# Patient Record
Sex: Female | Born: 1985 | Race: White | Hispanic: No | Marital: Married | Smoking: Never smoker
Health system: Southern US, Community
[De-identification: ages and names within clinical notes are randomized; demographics above are authoritative.]

---
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
## 2015-09-01 ENCOUNTER — Encounter (HOSPITAL_COMMUNITY): Payer: Self-pay | Admitting: Internal Medicine

## 2015-09-01 ENCOUNTER — Inpatient Hospital Stay (HOSPITAL_COMMUNITY)
Admission: EM | Admit: 2015-09-01 | Discharge: 2015-09-03 | DRG: 419 | Disposition: A | Discharge status: Home / Self Care | Payer: PRIVATE HEALTH INSURANCE | Source: Other Acute Inpatient Hospital | Attending: Family Medicine | Admitting: Family Medicine

## 2015-09-01 ENCOUNTER — Observation Stay (HOSPITAL_COMMUNITY): Payer: PRIVATE HEALTH INSURANCE

## 2015-09-01 ENCOUNTER — Encounter (HOSPITAL_COMMUNITY)
Admission: EM | Disposition: A | Discharge status: Home / Self Care | Payer: Self-pay | Source: Other Acute Inpatient Hospital | Attending: Family Medicine

## 2015-09-01 DIAGNOSIS — R1011 Right upper quadrant pain: Secondary | ICD-10-CM

## 2015-09-01 DIAGNOSIS — R109 Unspecified abdominal pain: Secondary | ICD-10-CM | POA: Diagnosis present

## 2015-09-01 DIAGNOSIS — K219 Gastro-esophageal reflux disease without esophagitis: Secondary | ICD-10-CM | POA: Diagnosis present

## 2015-09-01 DIAGNOSIS — K838 Other specified diseases of biliary tract: Secondary | ICD-10-CM

## 2015-09-01 DIAGNOSIS — R1013 Epigastric pain: Secondary | ICD-10-CM

## 2015-09-01 DIAGNOSIS — Z79899 Other long term (current) drug therapy: Secondary | ICD-10-CM

## 2015-09-01 DIAGNOSIS — K8020 Calculus of gallbladder without cholecystitis without obstruction: Secondary | ICD-10-CM | POA: Diagnosis not present

## 2015-09-01 DIAGNOSIS — K8050 Calculus of bile duct without cholangitis or cholecystitis without obstruction: Secondary | ICD-10-CM | POA: Diagnosis not present

## 2015-09-01 DIAGNOSIS — K810 Acute cholecystitis: Principal | ICD-10-CM | POA: Diagnosis present

## 2015-09-01 DIAGNOSIS — Z419 Encounter for procedure for purposes other than remedying health state, unspecified: Secondary | ICD-10-CM

## 2015-09-01 LAB — CBC WITH DIFFERENTIAL/PLATELET
BASOS PCT: 1 %
Basophils Absolute: 0 10*3/uL (ref 0.0–0.1)
EOS PCT: 2 %
Eosinophils Absolute: 0.1 10*3/uL (ref 0.0–0.7)
HEMATOCRIT: 33.9 % — AB (ref 36.0–46.0)
Hemoglobin: 11.3 g/dL — ABNORMAL LOW (ref 12.0–15.0)
Lymphocytes Relative: 30 %
Lymphs Abs: 2.1 10*3/uL (ref 0.7–4.0)
MCH: 31.3 pg (ref 26.0–34.0)
MCHC: 33.3 g/dL (ref 30.0–36.0)
MCV: 93.9 fL (ref 78.0–100.0)
MONO ABS: 0.5 10*3/uL (ref 0.1–1.0)
MONOS PCT: 8 %
NEUTROS ABS: 4.2 10*3/uL (ref 1.7–7.7)
Neutrophils Relative %: 59 %
PLATELETS: 184 10*3/uL (ref 150–400)
RBC: 3.61 MIL/uL — ABNORMAL LOW (ref 3.87–5.11)
RDW: 12.7 % (ref 11.5–15.5)
WBC: 7 10*3/uL (ref 4.0–10.5)

## 2015-09-01 LAB — HEPATIC FUNCTION PANEL
ALK PHOS: 37 U/L — AB (ref 38–126)
ALT: 35 U/L (ref 14–54)
AST: 25 U/L (ref 15–41)
Albumin: 3.5 g/dL (ref 3.5–5.0)
BILIRUBIN DIRECT: 0.2 mg/dL (ref 0.1–0.5)
Indirect Bilirubin: 1.1 mg/dL — ABNORMAL HIGH (ref 0.3–0.9)
Total Bilirubin: 1.3 mg/dL — ABNORMAL HIGH (ref 0.3–1.2)
Total Protein: 6 g/dL — ABNORMAL LOW (ref 6.5–8.1)

## 2015-09-01 LAB — BASIC METABOLIC PANEL
Anion gap: 5 (ref 5–15)
BUN: 5 mg/dL — ABNORMAL LOW (ref 6–20)
CALCIUM: 8.5 mg/dL — AB (ref 8.9–10.3)
CO2: 25 mmol/L (ref 22–32)
CREATININE: 0.64 mg/dL (ref 0.44–1.00)
Chloride: 111 mmol/L (ref 101–111)
GLUCOSE: 85 mg/dL (ref 65–99)
Potassium: 3.8 mmol/L (ref 3.5–5.1)
Sodium: 141 mmol/L (ref 135–145)

## 2015-09-01 LAB — PREGNANCY, URINE: Preg Test, Ur: NEGATIVE

## 2015-09-01 SURGERY — ERCP, WITH INTERVENTION IF INDICATED
Anesthesia: Monitor Anesthesia Care

## 2015-09-01 MED ORDER — SODIUM CHLORIDE 0.9 % IV SOLN
INTRAVENOUS | Status: AC
  Administered 2015-09-01: 03:00:00 via INTRAVENOUS

## 2015-09-01 MED ORDER — PIPERACILLIN-TAZOBACTAM 3.375 G IVPB
3.3750 g | Freq: Three times a day (TID) | INTRAVENOUS | Status: DC
  Administered 2015-09-01 – 2015-09-03 (×8): 3.375 g via INTRAVENOUS
  Filled 2015-09-01 (×9): qty 50

## 2015-09-01 MED ORDER — ACETAMINOPHEN 325 MG PO TABS
650.0000 mg | ORAL_TABLET | Freq: Four times a day (QID) | ORAL | Status: DC | PRN
  Administered 2015-09-02: 650 mg via ORAL
  Filled 2015-09-01 (×2): qty 2

## 2015-09-01 MED ORDER — ONDANSETRON HCL 4 MG PO TABS: 4.0000 mg | ORAL_TABLET | Freq: Four times a day (QID) | ORAL | Status: DC | PRN

## 2015-09-01 MED ORDER — ACETAMINOPHEN 650 MG RE SUPP: 650.0000 mg | Freq: Four times a day (QID) | RECTAL | Status: DC | PRN

## 2015-09-01 MED ORDER — MORPHINE SULFATE (PF) 2 MG/ML IV SOLN: 1.0000 mg | INTRAVENOUS | Status: DC | PRN

## 2015-09-01 MED ORDER — ONDANSETRON HCL 4 MG/2ML IJ SOLN
4.0000 mg | Freq: Four times a day (QID) | INTRAMUSCULAR | Status: DC | PRN
Start: 2015-09-01 — End: 2015-09-03
  Administered 2015-09-03: 4 mg via INTRAVENOUS
  Filled 2015-09-01: qty 2

## 2015-09-01 MED ORDER — GADOBENATE DIMEGLUMINE 529 MG/ML IV SOLN
15.0000 mL | Freq: Once | INTRAVENOUS | Status: AC
  Administered 2015-09-01: 13 mL via INTRAVENOUS

## 2015-09-01 NOTE — Progress Notes (Signed)
Pharmacy Antibiotic Note  Emily HerrlichBethany L Liu is a 30 y.o. female admitted on 09/01/2015 with Choledocholithiasis.  Pharmacy has been consulted for zosyn dosing.  Plan: Zosyn 3.375gm IV now over 30 min then 3.375gm IV q8h - subsequent doses over 4 hours Will f/u micro data, renal function, and pt's clinical condition   Height: 5\' 5"  (165.1 cm) Weight: 146 lb 9.6 oz (66.5 kg) IBW/kg (Calculated) : 57  Temp (24hrs), Avg:98.6 F (37 C), Min:98.6 F (37 C), Max:98.6 F (37 C)  No results for input(s): WBC, CREATININE, LATICACIDVEN, VANCOTROUGH, VANCOPEAK, VANCORANDOM, GENTTROUGH, GENTPEAK, GENTRANDOM, TOBRATROUGH, TOBRAPEAK, TOBRARND, AMIKACINPEAK, AMIKACINTROU, AMIKACIN in the last 168 hours.  CrCl cannot be calculated (No order found.).    Not on File  Antimicrobials this admission: 8/9 Zosyn >>   Thank you for allowing pharmacy to be a part of this patient's care.  Christoper Fabianaron Skylur Fuston, PharmD, BCPS Clinical pharmacist, pager [phone] 09/01/2015 2:41 AM

## 2015-09-01 NOTE — Consult Note (Signed)
[facility] Consult: 8:15 AM 09/01/2015  LOS: 1 day    Referring Provider: Dr Melynda Ripple  Primary Care Physician:  Blane Ohara, MD Primary Gastroenterologist:  unassigned     Reason for Consultation:  Choledocholithiasis suspected   HPI: Emily Liu is a 30 y.o. female.  No signif health issues.  Hx GERD controlled with prn Zantac.  04/2014 C section.  Tympanoplasty with tube placement on right x 2, reconstructive ear drum surgery x 1.  Tonsillectomy.   Went to [facility] ED yesterday after acute onset of epigastric pain, nausea.  sxs started 2 AM on 8/7.  Got a bit better but acute recurrence early AM on 8/8. Pain is in epigastrium and radiates into the back.  LFTs elevated, AST/ALT 49/53, improved this AM. .   Ultrasound with 8.5 mm CBD and ? CBD stone, gallstones.   Started on abx.        FM hx:  Mat uncle with hx colon cancer.  Mom has hx adenomatous colon polyps and has "yearly" colonoscopies.  Mom and several female relative on mom's side with GB disease.   Social: works full time as Haematologist.  Infrequent ETOH.  No smoking.     Prior to Admission medications   Medication Sig Start Date End Date Taking? Authorizing Provider  MONONESSA 0.25-35 MG-MCG tablet Take 1 tablet by mouth daily. 08/07/15  Yes Historical Provider, MD  ranitidine (ZANTAC) 150 MG tablet Take 150 mg by mouth daily as needed for heartburn.   Yes Historical Provider, MD    Scheduled Meds: . piperacillin-tazobactam (ZOSYN)  IV  3.375 g Intravenous Q8H   Infusions: . sodium chloride 100 mL/hr at 09/01/15 0245   PRN Meds: acetaminophen **OR** acetaminophen, morphine injection, ondansetron **OR** ondansetron (ZOFRAN) IV   Allergies as of 08/31/2015  . (Not on File)    Family History  Problem Relation Age of Onset  .  Diabetes Maternal Grandfather     Social History   Social History  . Marital status: Married   Social History Main Topics  . Smoking status: Never Smoker  . Smokeless tobacco: Never Used  . Alcohol use No   Constitutional:  No weakness  ENT:  No nose bleeds Pulm:  No SOB though deep breathing made pain worse.  CV:  No palpitations, no LE edema.  GU:  No hematuria, no frequency GI:  Per HPI Heme:  No issues with excessive bleeding   Transfusions:  none Neuro:  No headaches, no peripheral tingling or numbness Derm:  No itching, no rash or sores.  Endocrine:  No sweats or chills.  No polyuria or dysuria Immunization:  Not queried Travel:  None beyond local counties in last few months.    PHYSICAL EXAM: Vital signs in last 24 hours: Vitals:   09/01/15 0100 09/01/15 0535  BP: 114/73 101/62  Pulse: 84 81  Resp: 18 19  Temp: 98.6 F (37 C) 98.1 F (36.7 C)   Wt Readings from Last 3 Encounters:  09/01/15 66.5 kg (146 lb 9.6 oz)    General: pleasant,  comfortable.  Looks well. Head:  No swelling or asymmetry.   Eyes:  No icterus or pallor Ears:  Not HOH  Nose:  No discharge Mouth:  Clear, moist, good dentition Neck:  No JVD or masses Lungs:  Clear bil.  Unlabored breathing Heart: RRR.  No mrg Abdomen:  Soft, ND.  BS hypoactive.  Tender at upper abdomen, worst at epigastrium.  No guard or rebound.   Musc/Skeltl: no joint deformity Extremities:  No CCE  Neurologic:  Oriented x 3.  Fully alert.  No gross deficits.  Skin:  No rash, jaundice or sores Tattoos:  none   Psych:  Pleasant, calm.  Good spirits.    Intake/Output from previous day: 08/08 0701 - 08/09 0700 In: 365 [I.V.:315; IV Piggyback:50] Out: 700 [Urine:700] Intake/Output this shift: No intake/output data recorded.  LAB RESULTS:  Recent Labs  09/01/15 0241  WBC 7.0  HGB 11.3*  HCT 33.9*  PLT 184   BMET Lab Results  Component Value Date   NA 141 09/01/2015   K 3.8 09/01/2015   CL 111  09/01/2015   CO2 25 09/01/2015   GLUCOSE 85 09/01/2015   BUN 5 (L) 09/01/2015   CREATININE 0.64 09/01/2015   CALCIUM 8.5 (L) 09/01/2015   LFT  Recent Labs  09/01/15 0241  PROT 6.0*  ALBUMIN 3.5  AST 25  ALT 35  ALKPHOS 37*  BILITOT 1.3*  BILIDIR 0.2  IBILI 1.1*      IMPRESSION:   *  Choledocholithiasis?   *  Cholelithiasis.     PLAN:     *  MRCP.  Booking slot for ERCP for tomorrow in case we need this.  Can have clears once MRI completed.   *  Continue Zosyn.   *  Addendum 12/47 PM:  MRCP:  Cholelithiasis, duct 5 to 6 mm, no choledocholithiasis.  Cholelithiasis.  No choledocholithiasis. Some GB wall thickening Does not need ERCP Needs surgical eval.  Dr Melynda RippleHobbs notified.        Jennye MoccasinSarah Gribbin  09/01/2015, 8:15 AM Pager: [phone]      [facility] Attending   I have taken an interval history, reviewed the chart and examined the patient. I agree with the Advanced Practitioner's note, impression and recommendations.     MRCP reviewed - images also  Appers to have symptomatic cholelithiasis ? Cholecystitis. I suspect CBD size overestimated in US due to overlying cystic duct.  Will be available if needed.  Thanks  Iva Booparl E. Gessner, MD, [facility] [phone] (pager) [phone] after 5 PM, weekends and holidays  09/01/2015 5:09 PM

## 2015-09-01 NOTE — Progress Notes (Signed)
[facility] PROGRESS NOTE  Lyndle HerrlichBethany L Wiswell ZOX:[id number] DOB: [birth year] DOA: 09/01/2015 PCP: Blane OharaOX,KIRSTEN, MD  Assessment/Plan: 1. Abdominal pain with gallstones and possible choledocholithiasis  - Pending MRCP.  - Repeat LFTs and labs in AM.  - Consult by gastroenterologist appreciated. ERCP tomorrow? - Will eventually need cholecystectomy.  - Patient is on empiric antibiotics for now. Pregnancy screen isneg.   DVT prophylaxis: SCDs. Code Status: Full code.  Family Communication: Patient's mother.  Disposition Plan: Home.  Consults called: None.  Admission status: Observation. Will likely need to convert to inpt.  Consultants:  GI  Procedures:  ERCP planned  Antibiotics:  Zosyn 8/9--> P (indicate start date, and stop date if known)  HPI/Subjective: Pain better. Appetite good and nausea resolved. No new complaints. No fever overnight.  Objective: Vitals:   09/01/15 0100 09/01/15 0535  BP: 114/73 101/62  Pulse: 84 81  Resp: 18 19  Temp: 98.6 F (37 C) 98.1 F (36.7 C)    Intake/Output Summary (Last 24 hours) at 09/01/15 0938 Last data filed at 09/01/15 [id number]  Gross per 24 hour  Intake              365 ml  Output              700 ml  Net             -335 ml   Filed Weights   09/01/15 0100  Weight: 66.5 kg (146 lb 9.6 oz)    Exam:  General:  No diaphoresis, anxious, no acute distress Cardiovascular: Regular rate and rhythm no murmurs rubs or gallops Respiratory: Clear to auscultation bilaterally no more breathing Abdomen: Nondistended bowel sounds normal nontender palpation Musculoskeletal: Moving all extremities, no deformity, 5 out of 5 strength    Data Reviewed: Basic Metabolic Panel:  Recent Labs Lab 09/01/15 0241  NA 141  K 3.8  CL 111  CO2 25  GLUCOSE 85  BUN 5*  CREATININE 0.64  CALCIUM 8.5*   Liver Function Tests:  Recent Labs Lab 09/01/15 0241  AST 25  ALT 35  ALKPHOS 37*  BILITOT 1.3*  PROT 6.0*  ALBUMIN 3.5    No results for input(s): LIPASE, AMYLASE in the last 168 hours. No results for input(s): AMMONIA in the last 168 hours. CBC:  Recent Labs Lab 09/01/15 0241  WBC 7.0  NEUTROABS 4.2  HGB 11.3*  HCT 33.9*  MCV 93.9  PLT 184   Cardiac Enzymes: No results for input(s): CKTOTAL, CKMB, CKMBINDEX, TROPONINI in the last 168 hours. BNP (last 3 results) No results for input(s): BNP in the last 8760 hours.  ProBNP (last 3 results) No results for input(s): PROBNP in the last 8760 hours.  CBG: No results for input(s): GLUCAP in the last 168 hours.  No results found for this or any previous visit (from the past 240 hour(s)).   Studies: No results found.  Scheduled Meds: . piperacillin-tazobactam (ZOSYN)  IV  3.375 g Intravenous Q8H   Continuous Infusions: . sodium chloride 100 mL/hr at 09/01/15 0245    Principal Problem:   Abdominal pain Active Problems:   Choledocholithiasis    Time spent: 25    Haydee SalterPhillip M Hobbs  [facility] See AMION. If 7PM-7AM, please contact night-coverage at [email or web address], password [email or web address] 09/01/2015, 9:38 AM  LOS: 1 day

## 2015-09-01 NOTE — Progress Notes (Signed)
Spoke with patient and her mother concerning whether she will have surgery tomorrow. I informed her that from MD notes nothing is mention about surgery, but that the surgeon will speak with her in the morning. Charge nurse also spoke with patient and mother about the same issue. Ilean SkillVeronica Jentry Warnell LPN

## 2015-09-01 NOTE — H&P (Signed)
History and Physical    Emily HerrlichBethany L Brickey XBJ:[id number] DOB: 1985/10/08 DOA: 09/01/2015  PCP: Blane OharaOX,KIRSTEN, MD  [facility]  Chief Complaint: Abdominal pain.  HPI: Emily Liu is a 30 y.o. female with no significant past medical history started experiencing abdominal pain last afternoon. Pain is mostly epigastric in area with some nausea. Denies any diarrhea fever chills. In the ER labs revealed mildly elevated AST and ALT around 49 and 53 with sonogram of the abdomen showing CBD measuring 8.5 mm and finding concerning for choledocholithiasis. Gallbladder showed gallstones with no features of acute cholecystitis. ER physician had [facility] and [facility]. On my exam patient is presently not in distress. Abdomen is mildly tender in the right upper quadrant and epigastrium.   ED Course: Patient was a direct admit.  Review of Systems: As per HPI, rest all negative.   History reviewed. No pertinent past medical history.  Past Surgical History:  Procedure Laterality Date  . CESAREAN SECTION       reports that she has never smoked. She has never used smokeless tobacco. She reports that she does not drink alcohol. Her drug history is not on file.  Not on File  Family History  Problem Relation Age of Onset  . Diabetes Maternal Grandfather     Prior to Admission medications   Not on File    Physical Exam: Vitals:   09/01/15 0100  BP: 114/73  Pulse: 84  Resp: 18  Temp: 98.6 F (37 C)  TempSrc: Oral  SpO2: 98%  Weight: 146 lb 9.6 oz (66.5 kg)  Height: 5\' 5"  (1.651 m)      Constitutional: Not in distress. Vitals:   09/01/15 0100  BP: 114/73  Pulse: 84  Resp: 18  Temp: 98.6 F (37 C)  TempSrc: Oral  SpO2: 98%  Weight: 146 lb 9.6 oz (66.5 kg)  Height: 5\' 5"  (1.651 m)   Eyes: Anicteric no pallor. ENMT: No discharge from the ears eyes nose and mouth. Neck: No  mass felt. Respiratory: No rhonchi or crepitations. Cardiovascular: S1-S2 heard. Abdomen: Mild epigastric and right upper quadrant tenderness. No guarding or rigidity. Musculoskeletal: No edema. Skin: No rash. Neurologic: Alert awake oriented to time place and person. Moves all extremities. Psychiatric: Appears normal.   Labs on Admission: I have personally reviewed following labs and imaging studies  CBC: No results for input(s): WBC, NEUTROABS, HGB, HCT, MCV, PLT in the last 168 hours. Basic Metabolic Panel: No results for input(s): NA, K, CL, CO2, GLUCOSE, BUN, CREATININE, CALCIUM, MG, PHOS in the last 168 hours. GFR: CrCl cannot be calculated (No order found.). Liver Function Tests: No results for input(s): AST, ALT, ALKPHOS, BILITOT, PROT, ALBUMIN in the last 168 hours. No results for input(s): LIPASE, AMYLASE in the last 168 hours. No results for input(s): AMMONIA in the last 168 hours. Coagulation Profile: No results for input(s): INR, PROTIME in the last 168 hours. Cardiac Enzymes: No results for input(s): CKTOTAL, CKMB, CKMBINDEX, TROPONINI in the last 168 hours. BNP (last 3 results) No results for input(s): PROBNP in the last 8760 hours. HbA1C: No results for input(s): HGBA1C in the last 72 hours. CBG: No results for input(s): GLUCAP in the last 168 hours. Lipid Profile: No results for input(s): CHOL, HDL, LDLCALC, TRIG, CHOLHDL, LDLDIRECT in the last 72 hours. Thyroid Function Tests: No results for input(s): TSH, T4TOTAL, FREET4, T3FREE, THYROIDAB in the last 72 hours. Anemia  Panel: No results for input(s): VITAMINB12, FOLATE, FERRITIN, TIBC, IRON, RETICCTPCT in the last 72 hours. Urine analysis: No results found for: COLORURINE, APPEARANCEUR, LABSPEC, PHURINE, GLUCOSEU, HGBUR, BILIRUBINUR, KETONESUR, PROTEINUR, UROBILINOGEN, NITRITE, LEUKOCYTESUR Sepsis Labs: (procalcitonin:4,lacticidven:4) )No results found for this or any previous visit (from the past  240 hour(s)).   Radiological Exams on Admission: No results found.   Assessment/Plan Principal Problem:   Abdominal pain Active Problems:   Choledocholithiasis    1. Abdominal pain with gallstones and possible choledocholithiasis - I have ordered an MRCP. Repeat LFTs and labs are pending. Consult gastroenterologist in a.m. Will eventually need cholecystectomy. Patient is on empiric antibiotics for now. Pregnancy screen is pending.   DVT prophylaxis: SCDs. Code Status: Full code.  Family Communication: Patient's mother.  Disposition Plan: Home.  Consults called: None.  Admission status: Observation.    Eduard Clos MD [facility] Pager [phone].  If 7PM-7AM, please contact night-coverage [email or web address] Password [email or web address]  09/01/2015, 2:35 AM

## 2015-09-01 NOTE — Plan of Care (Signed)
Problem: Pain Managment: Goal: General experience of comfort will improve Outcome: Progressing No pain or nausea meds requested today  Problem: Fluid Volume: Goal: Ability to maintain a balanced intake and output will improve Outcome: Progressing Pt tolerating clear liquid diet well

## 2015-09-02 DIAGNOSIS — K219 Gastro-esophageal reflux disease without esophagitis: Secondary | ICD-10-CM | POA: Diagnosis present

## 2015-09-02 DIAGNOSIS — R109 Unspecified abdominal pain: Secondary | ICD-10-CM | POA: Diagnosis present

## 2015-09-02 DIAGNOSIS — K810 Acute cholecystitis: Secondary | ICD-10-CM | POA: Diagnosis present

## 2015-09-02 DIAGNOSIS — Z79899 Other long term (current) drug therapy: Secondary | ICD-10-CM | POA: Diagnosis not present

## 2015-09-02 DIAGNOSIS — K8050 Calculus of bile duct without cholangitis or cholecystitis without obstruction: Secondary | ICD-10-CM | POA: Diagnosis not present

## 2015-09-02 DIAGNOSIS — R1011 Right upper quadrant pain: Secondary | ICD-10-CM | POA: Diagnosis not present

## 2015-09-02 LAB — COMPREHENSIVE METABOLIC PANEL
ALBUMIN: 3.3 g/dL — AB (ref 3.5–5.0)
ALT: 26 U/L (ref 14–54)
AST: 14 U/L — AB (ref 15–41)
Alkaline Phosphatase: 33 U/L — ABNORMAL LOW (ref 38–126)
Anion gap: 5 (ref 5–15)
BILIRUBIN TOTAL: 1.4 mg/dL — AB (ref 0.3–1.2)
CHLORIDE: 110 mmol/L (ref 101–111)
CO2: 26 mmol/L (ref 22–32)
Calcium: 8.3 mg/dL — ABNORMAL LOW (ref 8.9–10.3)
Creatinine, Ser: 0.71 mg/dL (ref 0.44–1.00)
GFR calc Af Amer: >60 mL/min (ref >60)
GFR calc non Af Amer: >60 mL/min (ref >60)
GLUCOSE: 87 mg/dL (ref 65–99)
POTASSIUM: 3.7 mmol/L (ref 3.5–5.1)
SODIUM: 141 mmol/L (ref 135–145)
Total Protein: 5.9 g/dL — ABNORMAL LOW (ref 6.5–8.1)

## 2015-09-02 LAB — CBC WITH DIFFERENTIAL/PLATELET
Basophils Absolute: 0.1 10*3/uL (ref 0.0–0.1)
Basophils Relative: 1 %
EOS PCT: 3 %
Eosinophils Absolute: 0.2 10*3/uL (ref 0.0–0.7)
HEMATOCRIT: 34.6 % — AB (ref 36.0–46.0)
Hemoglobin: 11 g/dL — ABNORMAL LOW (ref 12.0–15.0)
LYMPHS ABS: 2.3 10*3/uL (ref 0.7–4.0)
LYMPHS PCT: 40 %
MCH: 30.4 pg (ref 26.0–34.0)
MCHC: 31.8 g/dL (ref 30.0–36.0)
MCV: 95.6 fL (ref 78.0–100.0)
MONOS PCT: 9 %
Monocytes Absolute: 0.5 10*3/uL (ref 0.1–1.0)
NEUTROS ABS: 2.6 10*3/uL (ref 1.7–7.7)
Neutrophils Relative %: 47 %
PLATELETS: 182 10*3/uL (ref 150–400)
RBC: 3.62 MIL/uL — ABNORMAL LOW (ref 3.87–5.11)
RDW: 12.8 % (ref 11.5–15.5)
WBC: 5.7 10*3/uL (ref 4.0–10.5)

## 2015-09-02 MED ORDER — KCL IN DEXTROSE-NACL 20-5-0.45 MEQ/L-%-% IV SOLN
INTRAVENOUS | Status: DC
  Administered 2015-09-02: via INTRAVENOUS
  Filled 2015-09-02 (×2): qty 1000

## 2015-09-02 NOTE — Consult Note (Signed)
[facility] Surgery Consult Note  Emily Liu October 20, 1985  [id number].    Requesting MD: Dr. Benjaman Lobe Chief Complaint/Reason for Consult: abdominal pain  HPI:  30 y/o female with a PMH of cesarean section 16 months ago who presented to [place] on 08/31/15 with epigastric abdominal pain that began the previous afternoon. States the pain was severe and had her "doubled over" with some radiation to her back. Has had burning epigastric pain in the past, associated with meals, that resolved with use of an H2 blocker. This pain was more severe and lasted longer. Denies a PMH gastric ulcers. Associated sxs were nausea and vomiting and one episode of diarrhea. ED workup was significant for mildly elevated AST/ALT (49/53) and a RUQ U/S concerning for CBD dilatation (8.5 mm) with gallstones present in the gallbladder. Patient was transferred to [facility] where she received an MRCP revealing a 5-6 mm CBD without the presence of CBD stones. The gallbladder was mildly distended with gallstones and no pericholecystic fluid. General surgery was asked to consult regarding surgical removal of the gallbladder.   Mother in the room with patient during my exam. Today the patient denies nausea and denies abdominal pain. Kindly and appropriately reports frustration with the amount of time she has had to wait to see a Psychologist, sport and exercise. The patient is currently NPO and on IV Zosyn. Last meal was at 9:30 PM yesterday. She has NKDA and denies use of blood thinning medications. She denies fever, chills, CP, SOB, and urinary sxs.   VSS, WBC normal.  Hepatic Function Latest Ref Rng & Units 09/02/2015 09/01/2015  Total Protein 6.5 - 8.1 g/dL 5.9(L) 6.0(L)  Albumin 3.5 - 5.0 g/dL 3.3(L) 3.5  AST 15 - 41 U/L 14(L) 25  ALT 14 - 54 U/L 26 35  Alk Phosphatase 38 - 126 U/L 33(L) 37(L)  Total Bilirubin 0.3 - 1.2 mg/dL 1.4(H) 1.3(H)  Bilirubin, Direct 0.1 - 0.5 mg/dL - 0.2   ROS: All systems reviewed and otherwise negative except for  as above  Family History  Problem Relation Age of Onset  . Diabetes Maternal Grandfather     History reviewed. No pertinent past medical history.  Past Surgical History:  Procedure Laterality Date  . CESAREAN SECTION      Social History:  reports that she has never smoked. She has never used smokeless tobacco. She reports that she does not drink alcohol. Her drug history is not on file.  Allergies: No Known Allergies  Medications Prior to Admission  Medication Sig Dispense Refill  . MONONESSA 0.25-35 MG-MCG tablet Take 1 tablet by mouth daily.  11  . ranitidine (ZANTAC) 150 MG tablet Take 150 mg by mouth daily as needed for heartburn.      Blood pressure 100/63, pulse 83, temperature 98.8 F (37.1 C), temperature source Oral, resp. rate 18, height 5' 5"  (1.651 m), weight 66.5 kg (146 lb 9.6 oz), last menstrual period 08/18/2015, SpO2 99 %. Physical Exam: General: pleasant, WD/WN white female who is laying in bed in NAD HEENT: head is normocephalic, atraumatic.  Sclera are noninjected.  Heart: regular, rate, and rhythm.  No obvious murmurs, gallops, or rubs noted.  Palpable pedal pulses bilaterally Lungs: CTAB, no wheezes, rhonchi, or rales noted.  Respiratory effort nonlabored Abd: soft, mildly tender to deep palpation of epigastrium, otherwise nontender, ND, +BS MS: all 4 extremities are symmetrical with no cyanosis, clubbing, or edema. Skin: warm and dry with no masses, lesions, or rashes Psych: A&Ox3 with an appropriate affect.  Neuro: normal speech  Results for orders placed or performed during the hospital encounter of 09/01/15 (from the past 48 hour(s))  Hepatic function panel     Status: Abnormal   Collection Time: 09/01/15  2:41 AM  Result Value Ref Range   Total Protein 6.0 (L) 6.5 - 8.1 g/dL   Albumin 3.5 3.5 - 5.0 g/dL   AST 25 15 - 41 U/L   ALT 35 14 - 54 U/L   Alkaline Phosphatase 37 (L) 38 - 126 U/L   Total Bilirubin 1.3 (H) 0.3 - 1.2 mg/dL   Bilirubin,  Direct 0.2 0.1 - 0.5 mg/dL   Indirect Bilirubin 1.1 (H) 0.3 - 0.9 mg/dL  CBC with Differential/Platelet     Status: Abnormal   Collection Time: 09/01/15  2:41 AM  Result Value Ref Range   WBC 7.0 4.0 - 10.5 K/uL   RBC 3.61 (L) 3.87 - 5.11 MIL/uL   Hemoglobin 11.3 (L) 12.0 - 15.0 g/dL   HCT 33.9 (L) 36.0 - 46.0 %   MCV 93.9 78.0 - 100.0 fL   MCH 31.3 26.0 - 34.0 pg   MCHC 33.3 30.0 - 36.0 g/dL   RDW 12.7 11.5 - 15.5 %   Platelets 184 150 - 400 K/uL   Neutrophils Relative % 59 %   Neutro Abs 4.2 1.7 - 7.7 K/uL   Lymphocytes Relative 30 %   Lymphs Abs 2.1 0.7 - 4.0 K/uL   Monocytes Relative 8 %   Monocytes Absolute 0.5 0.1 - 1.0 K/uL   Eosinophils Relative 2 %   Eosinophils Absolute 0.1 0.0 - 0.7 K/uL   Basophils Relative 1 %   Basophils Absolute 0.0 0.0 - 0.1 K/uL  Basic metabolic panel     Status: Abnormal   Collection Time: 09/01/15  2:41 AM  Result Value Ref Range   Sodium 141 135 - 145 mmol/L   Potassium 3.8 3.5 - 5.1 mmol/L   Chloride 111 101 - 111 mmol/L   CO2 25 22 - 32 mmol/L   Glucose, Bld 85 65 - 99 mg/dL   BUN 5 (L) 6 - 20 mg/dL   Creatinine, Ser 0.64 0.44 - 1.00 mg/dL   Calcium 8.5 (L) 8.9 - 10.3 mg/dL   GFR calc non Af Amer >60 >60 mL/min   GFR calc Af Amer >60 >60 mL/min    Comment: (NOTE) The eGFR has been calculated using the CKD EPI equation. This calculation has not been validated in all clinical situations. eGFR's persistently <60 mL/min signify possible Chronic Kidney Disease.    Anion gap 5 5 - 15  Pregnancy, urine     Status: None   Collection Time: 09/01/15  6:34 AM  Result Value Ref Range   Preg Test, Ur NEGATIVE NEGATIVE    Comment:        THE SENSITIVITY OF THIS METHODOLOGY IS >20 mIU/mL.   Comprehensive metabolic panel     Status: Abnormal   Collection Time: 09/02/15  5:40 AM  Result Value Ref Range   Sodium 141 135 - 145 mmol/L   Potassium 3.7 3.5 - 5.1 mmol/L   Chloride 110 101 - 111 mmol/L   CO2 26 22 - 32 mmol/L   Glucose, Bld 87  65 - 99 mg/dL   BUN <5 (L) 6 - 20 mg/dL   Creatinine, Ser 0.71 0.44 - 1.00 mg/dL   Calcium 8.3 (L) 8.9 - 10.3 mg/dL   Total Protein 5.9 (L) 6.5 - 8.1 g/dL   Albumin 3.3 (  L) 3.5 - 5.0 g/dL   AST 14 (L) 15 - 41 U/L   ALT 26 14 - 54 U/L   Alkaline Phosphatase 33 (L) 38 - 126 U/L   Total Bilirubin 1.4 (H) 0.3 - 1.2 mg/dL   GFR calc non Af Amer >60 >60 mL/min   GFR calc Af Amer >60 >60 mL/min    Comment: (NOTE) The eGFR has been calculated using the CKD EPI equation. This calculation has not been validated in all clinical situations. eGFR's persistently <60 mL/min signify possible Chronic Kidney Disease.    Anion gap 5 5 - 15  CBC with Differential/Platelet     Status: Abnormal   Collection Time: 09/02/15  5:40 AM  Result Value Ref Range   WBC 5.7 4.0 - 10.5 K/uL   RBC 3.62 (L) 3.87 - 5.11 MIL/uL   Hemoglobin 11.0 (L) 12.0 - 15.0 g/dL   HCT 34.6 (L) 36.0 - 46.0 %   MCV 95.6 78.0 - 100.0 fL   MCH 30.4 26.0 - 34.0 pg   MCHC 31.8 30.0 - 36.0 g/dL   RDW 12.8 11.5 - 15.5 %   Platelets 182 150 - 400 K/uL   Neutrophils Relative % 47 %   Neutro Abs 2.6 1.7 - 7.7 K/uL   Lymphocytes Relative 40 %   Lymphs Abs 2.3 0.7 - 4.0 K/uL   Monocytes Relative 9 %   Monocytes Absolute 0.5 0.1 - 1.0 K/uL   Eosinophils Relative 3 %   Eosinophils Absolute 0.2 0.0 - 0.7 K/uL   Basophils Relative 1 %   Basophils Absolute 0.1 0.0 - 0.1 K/uL   Mr 3d Recon At Scanner  Result Date: 09/01/2015 CLINICAL DATA:  30 year old female inpatient with acute epigastric abdominal pain and nausea, with cholelithiasis, dilated common bile duct and possible choledocholithiasis on ultrasound from 1 day prior. EXAM: MRI ABDOMEN WITHOUT AND WITH CONTRAST (INCLUDING MRCP) TECHNIQUE: Multiplanar multisequence MR imaging of the abdomen was performed both before and after the administration of intravenous contrast. Heavily T2-weighted images of the biliary and pancreatic ducts were obtained, and three-dimensional MRCP images were  rendered by post processing. CONTRAST:  21m MULTIHANCE GADOBENATE DIMEGLUMINE 529 MG/ML IV SOLN COMPARISON:  08/31/2015 right upper quadrant abdominal sonogram. FINDINGS: Lower chest: Subsegmental dependent atelectasis at both lung bases. Hepatobiliary: Normal liver size and configuration. Heterogeneous mild hepatic steatosis predominantly involving the right liver lobe. No liver mass. There are a few gallstones within the gallbladder measuring up to 1.3 cm with borderline mild gallbladder distention. There is mild eccentric gallbladder wall thickening adjacent to the liver. No pericholecystic fluid. No intrahepatic biliary ductal dilatation. Common bile duct diameter 5-6 mm, within normal limits. No choledocholithiasis. No biliary stricture or biliary or ampullary mass. Pancreas: No pancreatic mass or duct dilation.  No pancreas divisum. Spleen: Normal size. No mass. Adrenals/Urinary Tract: Normal adrenals. No hydronephrosis. Normal kidneys with no renal mass. Stomach/Bowel: Grossly normal stomach. Visualized small and large bowel is normal caliber, with no bowel wall thickening. Vascular/Lymphatic: Normal caliber abdominal aorta. Patent portal, splenic, hepatic and renal veins. No pathologically enlarged lymph nodes in the abdomen. Other: No abdominal ascites or focal fluid collection. 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 Musculoskeletal: No aggressive appearing focal osseous lesions. IMPRESSION: 1. Cholelithiasis. Borderline mild gallbladder distention. Mild eccentric gallbladder wall thickening adjacent to the liver. No pericholecystic fluid. MRI findings are equivocal for acute cholecystitis. Clinical correlation is necessary. Consider correlation with hepatobiliary scintigraphy as clinically warranted.  2. No biliary ductal dilatation. Common bile duct diameter 5-6 mm. No choledocholithiasis. 3. Incidentally detected developmental uterine anomaly,  suggestive of septate or didelphys uterus. If clinically warranted, this could be further evaluated with transabdominal and transvaginal ultrasound and/or dedicated pelvic MRI on an outpatient basis. Electronically Signed   By: Ilona Sorrel M.D.   On: 09/01/2015 12:41   Mr Jeananne Rama W/wo Cm/mrcp  Result Date: 09/01/2015 CLINICAL DATA:  30 year old female inpatient with acute epigastric abdominal pain and nausea, with cholelithiasis, dilated common bile duct and possible choledocholithiasis on ultrasound from 1 day prior. EXAM: MRI ABDOMEN WITHOUT AND WITH CONTRAST (INCLUDING MRCP) TECHNIQUE: Multiplanar multisequence MR imaging of the abdomen was performed both before and after the administration of intravenous contrast. Heavily T2-weighted images of the biliary and pancreatic ducts were obtained, and three-dimensional MRCP images were rendered by post processing. CONTRAST:  61m MULTIHANCE GADOBENATE DIMEGLUMINE 529 MG/ML IV SOLN COMPARISON:  08/31/2015 right upper quadrant abdominal sonogram. FINDINGS: Lower chest: Subsegmental dependent atelectasis at both lung bases. Hepatobiliary: Normal liver size and configuration. Heterogeneous mild hepatic steatosis predominantly involving the right liver lobe. No liver mass. There are a few gallstones within the gallbladder measuring up to 1.3 cm with borderline mild gallbladder distention. There is mild eccentric gallbladder wall thickening adjacent to the liver. No pericholecystic fluid. No intrahepatic biliary ductal dilatation. Common bile duct diameter 5-6 mm, within normal limits. No choledocholithiasis. No biliary stricture or biliary or ampullary mass. Pancreas: No pancreatic mass or duct dilation.  No pancreas divisum. Spleen: Normal size. No mass. Adrenals/Urinary Tract: Normal adrenals. No hydronephrosis. Normal kidneys with no renal mass. Stomach/Bowel: Grossly normal stomach. Visualized small and large bowel is normal caliber, with no bowel wall thickening.  Vascular/Lymphatic: Normal caliber abdominal aorta. Patent portal, splenic, hepatic and renal veins. No pathologically enlarged lymph nodes in the abdomen. Other: No abdominal ascites or focal fluid collection. 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 Musculoskeletal: No aggressive appearing focal osseous lesions. IMPRESSION: 1. Cholelithiasis. Borderline mild gallbladder distention. Mild eccentric gallbladder wall thickening adjacent to the liver. No pericholecystic fluid. MRI findings are equivocal for acute cholecystitis. Clinical correlation is necessary. Consider correlation with hepatobiliary scintigraphy as clinically warranted. 2. No biliary ductal dilatation. Common bile duct diameter 5-6 mm. No choledocholithiasis. 3. Incidentally detected developmental uterine anomaly, suggestive of septate or didelphys uterus. If clinically warranted, this could be further evaluated with transabdominal and transvaginal ultrasound and/or dedicated pelvic MRI on an outpatient basis. Electronically Signed   By: JIlona SorrelM.D.   On: 09/01/2015 12:41   Assessment/Plan Abdominal pain Symptomatic [facility]olelithiasis [facility] possible dilatation of CBD [facility] MRCP negative for CBD obstruction  - AST/ALT normal, no leukocytosis, and sxs resolving on IV Zosyn   FEN: NPO after MN ID: Zosyn DVT Proph: SCD's Dispo: laparoscopic [facility]olecystectomy tomorrow [facility] 09/02/2015, 8:14 AM Pager: [phone]: [phone]-Fri 7:00 am-4:30 pm Sat-Sun 7:00 am-11:30 am

## 2015-09-02 NOTE — Progress Notes (Signed)
Pt's mom concerned about surgery not being done today and asked to speak to patient advocate so that she could express her concern. Provided with patient advocate's phone number

## 2015-09-02 NOTE — Progress Notes (Signed)
[facility] PROGRESS NOTE  NOMIE BUCHBERGER ZOX:[id number] DOB: 24-Aug-1985 DOA: 09/01/2015 PCP: Blane Ohara, MD  Assessment/Plan: 1. Abdominal pain with gallstones and possible choledocholithiasis  - MRCP neg for CBD obstruction - Repeat LFTs and labs in AM.  - Consult by gastroenterologist appreciated. Signed off. No ERCP needed - Gen surg onboard. Recs appreciated. Lap Chol tomorrow. - Cont on empiric antibiotics for now. Pregnancy screen is neg.   DVT prophylaxis: SCDs. Code Status: Full code.  Family Communication: Patient's mother.  Disposition Plan: Home.  Consults called: None.  Admission status: Observation. Will likely need to convert to inpt.  Consultants:  GI  Procedures:  8/11 surgery  Antibiotics:  Zosyn 8/9--> P (indicate start date, and stop date if known)  HPI/Subjective: Pain better. Appetite good and nausea resolved. Upset over wait to see surgeon.  Objective: Vitals:   09/02/15 0645 09/02/15 1424  BP: 100/63 (!) 116/59  Pulse: 83 79  Resp: 18 18  Temp: 98.8 F (37.1 C) 97.8 F (36.6 C)    Intake/Output Summary (Last 24 hours) at 09/02/15 1946 Last data filed at 09/02/15 0647  Gross per 24 hour  Intake                0 ml  Output              400 ml  Net             -400 ml   Filed Weights   09/01/15 0100  Weight: 66.5 kg (146 lb 9.6 oz)    Exam:  General:  No diaphoresis, anxious, NAD Cardiovascular: Regular rate and rhythm no murmurs rubs or gallops Respiratory: Clear to auscultation bilaterally no more breathing Abdomen: Nondistended bowel sounds normal ; Epigastric tenderness, ND Musculoskeletal: Moving all extremities, no deformity, 5 out of 5 strength    Data Reviewed: Basic Metabolic Panel:  Recent Labs Lab 09/01/15 0241 09/02/15 0540  NA 141 141  K 3.8 3.7  CL 111 110  CO2 25 26  GLUCOSE 85 87  BUN 5* <5*  CREATININE 0.64 0.71  CALCIUM 8.5* 8.3*   Liver Function Tests:  Recent Labs Lab 09/01/15 0241  09/02/15 0540  AST 25 14*  ALT 35 26  ALKPHOS 37* 33*  BILITOT 1.3* 1.4*  PROT 6.0* 5.9*  ALBUMIN 3.5 3.3*   No results for input(s): LIPASE, AMYLASE in the last 168 hours. No results for input(s): AMMONIA in the last 168 hours. CBC:  Recent Labs Lab 09/01/15 0241 09/02/15 0540  WBC 7.0 5.7  NEUTROABS 4.2 2.6  HGB 11.3* 11.0*  HCT 33.9* 34.6*  MCV 93.9 95.6  PLT 184 182   Cardiac Enzymes: No results for input(s): CKTOTAL, CKMB, CKMBINDEX, TROPONINI in the last 168 hours. BNP (last 3 results) No results for input(s): BNP in the last 8760 hours.  ProBNP (last 3 results) No results for input(s): PROBNP in the last 8760 hours.  CBG: No results for input(s): GLUCAP in the last 168 hours.  No results found for this or any previous visit (from the past 240 hour(s)).   Studies: Mr 3d Recon At Scanner  Result Date: 09/01/2015 CLINICAL DATA:  30 year old female inpatient with acute epigastric abdominal pain and nausea, with cholelithiasis, dilated common bile duct and possible choledocholithiasis on ultrasound from 1 day prior. EXAM: MRI ABDOMEN WITHOUT AND WITH CONTRAST (INCLUDING MRCP) TECHNIQUE: Multiplanar multisequence MR imaging of the abdomen was performed both before and after the administration of intravenous contrast. Heavily T2-weighted images of  the biliary and pancreatic ducts were obtained, and three-dimensional MRCP images were rendered by post processing. CONTRAST:  13mL MULTIHANCE GADOBENATE DIMEGLUMINE 529 MG/ML IV SOLN COMPARISON:  08/31/2015 right upper quadrant abdominal sonogram. FINDINGS: Lower chest: Subsegmental dependent atelectasis at both lung bases. Hepatobiliary: Normal liver size and configuration. Heterogeneous mild hepatic steatosis predominantly involving the right liver lobe. No liver mass. There are a few gallstones within the gallbladder measuring up to 1.3 cm with borderline mild gallbladder distention. There is mild eccentric gallbladder wall  thickening adjacent to the liver. No pericholecystic fluid. No intrahepatic biliary ductal dilatation. Common bile duct diameter 5-6 mm, within normal limits. No choledocholithiasis. No biliary stricture or biliary or ampullary mass. Pancreas: No pancreatic mass or duct dilation.  No pancreas divisum. Spleen: Normal size. No mass. Adrenals/Urinary Tract: Normal adrenals. No hydronephrosis. Normal kidneys with no renal mass. Stomach/Bowel: Grossly normal stomach. Visualized small and large bowel is normal caliber, with no bowel wall thickening. Vascular/Lymphatic: Normal caliber abdominal aorta. Patent portal, splenic, hepatic and renal veins. No pathologically enlarged lymph nodes in the abdomen. Other: No abdominal ascites or focal fluid collection. 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 Musculoskeletal: No aggressive appearing focal osseous lesions. IMPRESSION: 1. Cholelithiasis. Borderline mild gallbladder distention. Mild eccentric gallbladder wall thickening adjacent to the liver. No pericholecystic fluid. MRI findings are equivocal for acute cholecystitis. Clinical correlation is necessary. Consider correlation with hepatobiliary scintigraphy as clinically warranted. 2. No biliary ductal dilatation. Common bile duct diameter 5-6 mm. No choledocholithiasis. 3. Incidentally detected developmental uterine anomaly, suggestive of septate or didelphys uterus. If clinically warranted, this could be further evaluated with transabdominal and transvaginal ultrasound and/or dedicated pelvic MRI on an outpatient basis. Electronically Signed   By: Delbert Phenix M.D.   On: 09/01/2015 12:41   Mr Roe Coombs W/wo Cm/mrcp  Result Date: 09/01/2015 CLINICAL DATA:  30 year old female inpatient with acute epigastric abdominal pain and nausea, with cholelithiasis, dilated common bile duct and possible choledocholithiasis on ultrasound from 1 day prior. EXAM: MRI ABDOMEN WITHOUT  AND WITH CONTRAST (INCLUDING MRCP) TECHNIQUE: Multiplanar multisequence MR imaging of the abdomen was performed both before and after the administration of intravenous contrast. Heavily T2-weighted images of the biliary and pancreatic ducts were obtained, and three-dimensional MRCP images were rendered by post processing. CONTRAST:  13mL MULTIHANCE GADOBENATE DIMEGLUMINE 529 MG/ML IV SOLN COMPARISON:  08/31/2015 right upper quadrant abdominal sonogram. FINDINGS: Lower chest: Subsegmental dependent atelectasis at both lung bases. Hepatobiliary: Normal liver size and configuration. Heterogeneous mild hepatic steatosis predominantly involving the right liver lobe. No liver mass. There are a few gallstones within the gallbladder measuring up to 1.3 cm with borderline mild gallbladder distention. There is mild eccentric gallbladder wall thickening adjacent to the liver. No pericholecystic fluid. No intrahepatic biliary ductal dilatation. Common bile duct diameter 5-6 mm, within normal limits. No choledocholithiasis. No biliary stricture or biliary or ampullary mass. Pancreas: No pancreatic mass or duct dilation.  No pancreas divisum. Spleen: Normal size. No mass. Adrenals/Urinary Tract: Normal adrenals. No hydronephrosis. Normal kidneys with no renal mass. Stomach/Bowel: Grossly normal stomach. Visualized small and large bowel is normal caliber, with no bowel wall thickening. Vascular/Lymphatic: Normal caliber abdominal aorta. Patent portal, splenic, hepatic and renal veins. No pathologically enlarged lymph nodes in the abdomen. Other: No abdominal ascites or focal fluid collection. 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 Musculoskeletal: No aggressive appearing focal osseous lesions. IMPRESSION: 1. Cholelithiasis. Borderline mild gallbladder  distention. Mild eccentric gallbladder wall thickening adjacent to the liver. No pericholecystic fluid. MRI findings are  equivocal for acute cholecystitis. Clinical correlation is necessary. Consider correlation with hepatobiliary scintigraphy as clinically warranted. 2. No biliary ductal dilatation. Common bile duct diameter 5-6 mm. No choledocholithiasis. 3. Incidentally detected developmental uterine anomaly, suggestive of septate or didelphys uterus. If clinically warranted, this could be further evaluated with transabdominal and transvaginal ultrasound and/or dedicated pelvic MRI on an outpatient basis. Electronically Signed   By: Delbert PhenixJason A Poff M.D.   On: 09/01/2015 12:41    Scheduled Meds: . piperacillin-tazobactam (ZOSYN)  IV  3.375 g Intravenous Q8H   Continuous Infusions:    Principal Problem:   Abdominal pain Active Problems:   Choledocholithiasis    Time spent: 25    Haydee SalterPhillip M Hobbs  [facility] See AMION. If 7PM-7AM, please contact night-coverage at [email or web address], password [email or web address] 09/02/2015, 7:46 PM  LOS: 1 day

## 2015-09-03 ENCOUNTER — Encounter (HOSPITAL_COMMUNITY)
Admission: EM | Disposition: A | Discharge status: Home / Self Care | Payer: Self-pay | Source: Other Acute Inpatient Hospital | Attending: Family Medicine

## 2015-09-03 ENCOUNTER — Inpatient Hospital Stay (HOSPITAL_COMMUNITY): Payer: PRIVATE HEALTH INSURANCE | Admitting: Anesthesiology

## 2015-09-03 ENCOUNTER — Encounter (HOSPITAL_COMMUNITY): Payer: Self-pay | Admitting: Anesthesiology

## 2015-09-03 ENCOUNTER — Inpatient Hospital Stay (HOSPITAL_COMMUNITY): Payer: PRIVATE HEALTH INSURANCE

## 2015-09-03 HISTORY — PX: CHOLECYSTECTOMY: SHX55

## 2015-09-03 LAB — CBC WITH DIFFERENTIAL/PLATELET
BASOS ABS: 0 10*3/uL (ref 0.0–0.1)
BASOS PCT: 1 %
EOS ABS: 0.2 10*3/uL (ref 0.0–0.7)
Eosinophils Relative: 3 %
HCT: 35.2 % — ABNORMAL LOW (ref 36.0–46.0)
HEMOGLOBIN: 11.3 g/dL — AB (ref 12.0–15.0)
Lymphocytes Relative: 38 %
Lymphs Abs: 2.4 10*3/uL (ref 0.7–4.0)
MCH: 30.3 pg (ref 26.0–34.0)
MCHC: 32.1 g/dL (ref 30.0–36.0)
MCV: 94.4 fL (ref 78.0–100.0)
MONO ABS: 0.6 10*3/uL (ref 0.1–1.0)
MONOS PCT: 10 %
NEUTROS PCT: 48 %
Neutro Abs: 3 10*3/uL (ref 1.7–7.7)
Platelets: 194 10*3/uL (ref 150–400)
RBC: 3.73 MIL/uL — ABNORMAL LOW (ref 3.87–5.11)
RDW: 12.5 % (ref 11.5–15.5)
WBC: 6.3 10*3/uL (ref 4.0–10.5)

## 2015-09-03 LAB — BASIC METABOLIC PANEL
Anion gap: 9 (ref 5–15)
BUN: 8 mg/dL (ref 6–20)
CALCIUM: 8.9 mg/dL (ref 8.9–10.3)
CO2: 25 mmol/L (ref 22–32)
Chloride: 107 mmol/L (ref 101–111)
Creatinine, Ser: 0.67 mg/dL (ref 0.44–1.00)
GFR calc Af Amer: >60 mL/min (ref >60)
GLUCOSE: 107 mg/dL — AB (ref 65–99)
Potassium: 3.5 mmol/L (ref 3.5–5.1)
SODIUM: 141 mmol/L (ref 135–145)

## 2015-09-03 LAB — SURGICAL PCR SCREEN
MRSA, PCR: NEGATIVE
Staphylococcus aureus: NEGATIVE

## 2015-09-03 LAB — PROTIME-INR
INR: 1.05
PROTHROMBIN TIME: 13.8 s (ref 11.4–15.2)

## 2015-09-03 SURGERY — LAPAROSCOPIC CHOLECYSTECTOMY WITH INTRAOPERATIVE CHOLANGIOGRAM
Anesthesia: General | Site: Abdomen

## 2015-09-03 MED ORDER — DIPHENHYDRAMINE HCL 12.5 MG/5ML PO ELIX: 12.5000 mg | ORAL_SOLUTION | Freq: Four times a day (QID) | ORAL | Status: DC | PRN

## 2015-09-03 MED ORDER — MIDAZOLAM HCL 5 MG/5ML IJ SOLN
INTRAMUSCULAR | Status: DC | PRN
  Administered 2015-09-03: 2 mg via INTRAVENOUS

## 2015-09-03 MED ORDER — MEPERIDINE HCL 25 MG/ML IJ SOLN: 6.2500 mg | INTRAMUSCULAR | Status: DC | PRN

## 2015-09-03 MED ORDER — FENTANYL CITRATE (PF) 250 MCG/5ML IJ SOLN
INTRAMUSCULAR | Status: AC
  Filled 2015-09-03: qty 5

## 2015-09-03 MED ORDER — MIDAZOLAM HCL 2 MG/2ML IJ SOLN
INTRAMUSCULAR | Status: AC
  Filled 2015-09-03: qty 2

## 2015-09-03 MED ORDER — NEOSTIGMINE METHYLSULFATE 5 MG/5ML IV SOSY
PREFILLED_SYRINGE | INTRAVENOUS | Status: AC
  Filled 2015-09-03: qty 5

## 2015-09-03 MED ORDER — MORPHINE SULFATE (PF) 2 MG/ML IV SOLN: 1.0000 mg | INTRAVENOUS | Status: DC | PRN

## 2015-09-03 MED ORDER — ONDANSETRON HCL 4 MG/2ML IJ SOLN
INTRAMUSCULAR | Status: AC
  Filled 2015-09-03: qty 2

## 2015-09-03 MED ORDER — METOCLOPRAMIDE HCL 5 MG/ML IJ SOLN
INTRAMUSCULAR | Status: DC | PRN
  Administered 2015-09-03: 10 mg via INTRAVENOUS

## 2015-09-03 MED ORDER — SODIUM CHLORIDE 0.9 % IR SOLN
Status: DC | PRN
  Administered 2015-09-03: 1000 mL

## 2015-09-03 MED ORDER — IOPAMIDOL (ISOVUE-300) INJECTION 61%
INTRAVENOUS | Status: AC
  Filled 2015-09-03: qty 50

## 2015-09-03 MED ORDER — HYDROMORPHONE HCL 1 MG/ML IJ SOLN
INTRAMUSCULAR | Status: DC
Start: 2015-09-03 — End: 2015-09-03
  Filled 2015-09-03: qty 1

## 2015-09-03 MED ORDER — ACETAMINOPHEN 500 MG PO TABS
1000.0000 mg | ORAL_TABLET | Freq: Four times a day (QID) | ORAL | Status: DC
  Administered 2015-09-03: 1000 mg via ORAL
  Filled 2015-09-03: qty 2

## 2015-09-03 MED ORDER — SUCCINYLCHOLINE CHLORIDE 200 MG/10ML IV SOSY
PREFILLED_SYRINGE | INTRAVENOUS | Status: AC
  Filled 2015-09-03: qty 10

## 2015-09-03 MED ORDER — HYDROMORPHONE HCL 1 MG/ML IJ SOLN
0.2500 mg | INTRAMUSCULAR | Status: DC | PRN
  Administered 2015-09-03 (×3): 0.5 mg via INTRAVENOUS

## 2015-09-03 MED ORDER — METOCLOPRAMIDE HCL 5 MG/ML IJ SOLN: 10.0000 mg | Freq: Once | INTRAMUSCULAR | Status: DC | PRN

## 2015-09-03 MED ORDER — SODIUM CHLORIDE 0.9 % IV SOLN
INTRAVENOUS | Status: DC | PRN
  Administered 2015-09-03: 10 mL

## 2015-09-03 MED ORDER — DIPHENHYDRAMINE HCL 50 MG/ML IJ SOLN: 12.5000 mg | Freq: Four times a day (QID) | INTRAMUSCULAR | Status: DC | PRN

## 2015-09-03 MED ORDER — HYDROMORPHONE HCL 1 MG/ML IJ SOLN
INTRAMUSCULAR | Status: AC
  Filled 2015-09-03: qty 1

## 2015-09-03 MED ORDER — PROMETHAZINE HCL 25 MG/ML IJ SOLN: 12.5000 mg | Freq: Four times a day (QID) | INTRAMUSCULAR | Status: DC | PRN

## 2015-09-03 MED ORDER — BUPIVACAINE-EPINEPHRINE (PF) 0.25% -1:200000 IJ SOLN
INTRAMUSCULAR | Status: AC
  Filled 2015-09-03: qty 30

## 2015-09-03 MED ORDER — ACETAMINOPHEN 325 MG PO TABS: 650.0000 mg | ORAL_TABLET | Freq: Four times a day (QID) | ORAL | 0 refills | Status: AC | PRN

## 2015-09-03 MED ORDER — ROCURONIUM BROMIDE 100 MG/10ML IV SOLN
INTRAVENOUS | Status: DC | PRN
  Administered 2015-09-03: 10 mg via INTRAVENOUS
  Administered 2015-09-03: 30 mg via INTRAVENOUS

## 2015-09-03 MED ORDER — LIDOCAINE HCL (CARDIAC) 20 MG/ML IV SOLN
INTRAVENOUS | Status: DC | PRN
  Administered 2015-09-03: 25 mg via INTRAVENOUS
  Administered 2015-09-03: 75 mg via INTRAVENOUS

## 2015-09-03 MED ORDER — FENTANYL CITRATE (PF) 100 MCG/2ML IJ SOLN
INTRAMUSCULAR | Status: DC | PRN
  Administered 2015-09-03: 100 ug via INTRAVENOUS
  Administered 2015-09-03 (×3): 50 ug via INTRAVENOUS

## 2015-09-03 MED ORDER — GLYCOPYRROLATE 0.2 MG/ML IJ SOLN
INTRAMUSCULAR | Status: DC | PRN
  Administered 2015-09-03: 0.6 mg via INTRAVENOUS

## 2015-09-03 MED ORDER — OXYCODONE HCL 5 MG PO TABS
5.0000 mg | ORAL_TABLET | ORAL | Status: DC | PRN
  Administered 2015-09-03: 5 mg via ORAL
  Filled 2015-09-03: qty 1

## 2015-09-03 MED ORDER — 0.9 % SODIUM CHLORIDE (POUR BTL) OPTIME
TOPICAL | Status: DC | PRN
  Administered 2015-09-03: 1000 mL

## 2015-09-03 MED ORDER — GLYCOPYRROLATE 0.2 MG/ML IV SOSY
PREFILLED_SYRINGE | INTRAVENOUS | Status: AC
  Filled 2015-09-03: qty 3

## 2015-09-03 MED ORDER — ENOXAPARIN SODIUM 40 MG/0.4ML ~~LOC~~ SOLN
40.0000 mg | SUBCUTANEOUS | Status: DC
Start: 2015-09-04 — End: 2015-09-03

## 2015-09-03 MED ORDER — EPHEDRINE SULFATE 50 MG/ML IJ SOLN
INTRAMUSCULAR | Status: DC | PRN
  Administered 2015-09-03 (×2): 5 mg via INTRAVENOUS

## 2015-09-03 MED ORDER — BUPIVACAINE-EPINEPHRINE 0.25% -1:200000 IJ SOLN
INTRAMUSCULAR | Status: DC | PRN
  Administered 2015-09-03: 30 mL

## 2015-09-03 MED ORDER — KETOROLAC TROMETHAMINE 30 MG/ML IJ SOLN
INTRAMUSCULAR | Status: DC | PRN
  Administered 2015-09-03: 30 mg via INTRAVENOUS

## 2015-09-03 MED ORDER — ONDANSETRON HCL 4 MG PO TABS: 4.0000 mg | ORAL_TABLET | Freq: Four times a day (QID) | ORAL | 0 refills | Status: AC | PRN

## 2015-09-03 MED ORDER — PROPOFOL 10 MG/ML IV BOLUS
INTRAVENOUS | Status: DC | PRN
  Administered 2015-09-03: 30 mg via INTRAVENOUS
  Administered 2015-09-03: 170 mg via INTRAVENOUS

## 2015-09-03 MED ORDER — ROCURONIUM BROMIDE 10 MG/ML (PF) SYRINGE
PREFILLED_SYRINGE | INTRAVENOUS | Status: AC
  Filled 2015-09-03: qty 10

## 2015-09-03 MED ORDER — SCOPOLAMINE 1 MG/3DAYS TD PT72
MEDICATED_PATCH | TRANSDERMAL | Status: DC | PRN
  Administered 2015-09-03: 1 via TRANSDERMAL

## 2015-09-03 MED ORDER — OXYCODONE HCL 5 MG PO TABS: 5.0000 mg | ORAL_TABLET | Freq: Four times a day (QID) | ORAL | 0 refills | Status: AC | PRN

## 2015-09-03 MED ORDER — NEOSTIGMINE METHYLSULFATE 10 MG/10ML IV SOLN
INTRAVENOUS | Status: DC | PRN
  Administered 2015-09-03: 4 mg via INTRAVENOUS

## 2015-09-03 MED ORDER — LIDOCAINE 2% (20 MG/ML) 5 ML SYRINGE
INTRAMUSCULAR | Status: AC
  Filled 2015-09-03: qty 5

## 2015-09-03 MED ORDER — IBUPROFEN 800 MG PO TABS: 800.0000 mg | ORAL_TABLET | Freq: Three times a day (TID) | ORAL | 0 refills | Status: AC | PRN

## 2015-09-03 MED ORDER — LACTATED RINGERS IV SOLN
INTRAVENOUS | Status: DC | PRN
  Administered 2015-09-03 (×2): via INTRAVENOUS

## 2015-09-03 MED ORDER — PROPOFOL 10 MG/ML IV BOLUS
INTRAVENOUS | Status: AC
  Filled 2015-09-03: qty 40

## 2015-09-03 SURGICAL SUPPLY — 50 items
APPLIER CLIP 5 13 M/L LIGAMAX5 (MISCELLANEOUS) ×3
BANDAGE ADH SHEER 1  50/CT (GAUZE/BANDAGES/DRESSINGS) ×3 IMPLANT
BENZOIN TINCTURE PRP APPL 2/3 (GAUZE/BANDAGES/DRESSINGS) ×3 IMPLANT
BLADE SURG ROTATE 9660 (MISCELLANEOUS) IMPLANT
CANISTER SUCTION 2500CC (MISCELLANEOUS) ×3 IMPLANT
CHLORAPREP W/TINT 26ML (MISCELLANEOUS) ×3 IMPLANT
CLIP APPLIE 5 13 M/L LIGAMAX5 (MISCELLANEOUS) ×1 IMPLANT
CLOSURE WOUND 1/2 X4 (GAUZE/BANDAGES/DRESSINGS) ×1
COVER MAYO STAND STRL (DRAPES) ×3 IMPLANT
COVER SURGICAL LIGHT HANDLE (MISCELLANEOUS) ×3 IMPLANT
DEVICE TROCAR PUNCTURE CLOSURE (ENDOMECHANICALS) ×3 IMPLANT
DRAPE C-ARM 42X72 X-RAY (DRAPES) ×3 IMPLANT
DRSG TEGADERM 2-3/8X2-3/4 SM (GAUZE/BANDAGES/DRESSINGS) ×3 IMPLANT
DRSG TEGADERM 4X4.75 (GAUZE/BANDAGES/DRESSINGS) ×3 IMPLANT
ELECT REM PT RETURN 9FT ADLT (ELECTROSURGICAL) ×3
ELECTRODE REM PT RTRN 9FT ADLT (ELECTROSURGICAL) ×1 IMPLANT
GAUZE SPONGE 2X2 8PLY STRL LF (GAUZE/BANDAGES/DRESSINGS) ×1 IMPLANT
GLOVE BIOGEL M STRL SZ7.5 (GLOVE) ×3 IMPLANT
GLOVE BIOGEL PI IND STRL 7.0 (GLOVE) ×2 IMPLANT
GLOVE BIOGEL PI IND STRL 8 (GLOVE) ×3 IMPLANT
GLOVE BIOGEL PI INDICATOR 7.0 (GLOVE) ×4
GLOVE BIOGEL PI INDICATOR 8 (GLOVE) ×6
GLOVE ECLIPSE 7.5 STRL STRAW (GLOVE) ×3 IMPLANT
GLOVE SURG SS PI 8.0 STRL IVOR (GLOVE) ×3 IMPLANT
GOWN STRL REUS W/ TWL LRG LVL3 (GOWN DISPOSABLE) IMPLANT
GOWN STRL REUS W/ TWL XL LVL3 (GOWN DISPOSABLE) ×2 IMPLANT
GOWN STRL REUS W/TWL 2XL LVL3 (GOWN DISPOSABLE) ×3 IMPLANT
GOWN STRL REUS W/TWL LRG LVL3 (GOWN DISPOSABLE)
GOWN STRL REUS W/TWL XL LVL3 (GOWN DISPOSABLE) ×4
KIT BASIN OR (CUSTOM PROCEDURE TRAY) ×3 IMPLANT
KIT ROOM TURNOVER OR (KITS) ×3 IMPLANT
NS IRRIG 1000ML POUR BTL (IV SOLUTION) ×3 IMPLANT
PAD ARMBOARD 7.5X6 YLW CONV (MISCELLANEOUS) ×3 IMPLANT
POUCH RETRIEVAL ECOSAC 10 (ENDOMECHANICALS) ×1 IMPLANT
POUCH RETRIEVAL ECOSAC 10MM (ENDOMECHANICALS) ×2
SCISSORS LAP 5X35 DISP (ENDOMECHANICALS) ×6 IMPLANT
SET CHOLANGIOGRAPH 5 50 .035 (SET/KITS/TRAYS/PACK) ×3 IMPLANT
SET IRRIG TUBING LAPAROSCOPIC (IRRIGATION / IRRIGATOR) ×3 IMPLANT
SLEEVE ENDOPATH XCEL 5M (ENDOMECHANICALS) ×9 IMPLANT
SPECIMEN JAR SMALL (MISCELLANEOUS) ×3 IMPLANT
SPONGE GAUZE 2X2 STER 10/PKG (GAUZE/BANDAGES/DRESSINGS) ×2
STRIP CLOSURE SKIN 1/2X4 (GAUZE/BANDAGES/DRESSINGS) ×2 IMPLANT
SUT MNCRL AB 4-0 PS2 18 (SUTURE) ×3 IMPLANT
SUT VICRYL 0 UR6 27IN ABS (SUTURE) ×3 IMPLANT
TOWEL OR 17X24 6PK STRL BLUE (TOWEL DISPOSABLE) ×3 IMPLANT
TOWEL OR 17X26 10 PK STRL BLUE (TOWEL DISPOSABLE) ×3 IMPLANT
TRAY LAPAROSCOPIC MC (CUSTOM PROCEDURE TRAY) ×3 IMPLANT
TROCAR XCEL BLUNT TIP 100MML (ENDOMECHANICALS) ×3 IMPLANT
TROCAR XCEL NON-BLD 5MMX100MML (ENDOMECHANICALS) ×3 IMPLANT
TUBING INSUFFLATION (TUBING) ×3 IMPLANT

## 2015-09-03 NOTE — Anesthesia Preprocedure Evaluation (Addendum)
Anesthesia Evaluation  Patient identified by MRN, date of birth, ID band Patient awake    Reviewed: Allergy & Precautions, NPO status , Patient's Chart, lab work & pertinent test results  Airway Mallampati: III  TM Distance: >3 FB Neck ROM: Full    Dental no notable dental hx. (+) Teeth Intact   Pulmonary neg pulmonary ROS,    Pulmonary exam normal breath sounds clear to auscultation       Cardiovascular negative cardio ROS Normal cardiovascular exam Rhythm:Regular Rate:Normal     Neuro/Psych negative neurological ROS  negative psych ROS   GI/Hepatic GERD  Medicated and Controlled,Mildly elevated LFT's Abdominal pain Cholelithiasis with Choledocholithiasis   Endo/Other  negative endocrine ROS  Renal/GU negative Renal ROS  negative genitourinary   Musculoskeletal negative musculoskeletal ROS (+)   Abdominal   Peds  Hematology  (+) anemia ,   Anesthesia Other Findings   Reproductive/Obstetrics negative OB ROS                            Anesthesia Physical Anesthesia Plan  ASA: II  Anesthesia Plan: General   Post-op Pain Management:    Induction: Intravenous and Cricoid pressure planned  Airway Management Planned: Oral ETT  Additional Equipment:   Intra-op Plan:   Post-operative Plan: Extubation in OR  Informed Consent: I have reviewed the patients History and Physical, chart, labs and discussed the procedure including the risks, benefits and alternatives for the proposed anesthesia with the patient or authorized representative who has indicated his/her understanding and acceptance.   Dental advisory given  Plan Discussed with: CRNA, Anesthesiologist and Surgeon  Anesthesia Plan Comments:         Anesthesia Quick Evaluation

## 2015-09-03 NOTE — Interval H&P Note (Signed)
History and Physical Interval Note:  09/03/2015 7:34 AM  Michon L Jobst  has presented today for surgery, with the diagnosis of SYMPTOMATIC CHOLELITHIASIS  The various methods of treatment have been discussed with the patient and family. After consideration of risks, benefits and other options for treatment, the patient has consented to  Procedure(s): LAPAROSCOPIC CHOLECYSTECTOMY WITH POSSIBLE  INTRAOPERATIVE CHOLANGIOGRAM (N/A) as a surgical intervention .  The patient's history has been reviewed, patient examined, no change in status, stable for surgery.  I have reviewed the patient's chart and labs.  Questions were answered to the patient's satisfaction.    I believe the patient's symptoms are consistent with gallbladder disease.  We discussed gallbladder disease.   I discussed laparoscopic cholecystectomy with IOC in detail.  The patient was shown Agricultural engineereducational material as well as diagrams detailing the procedure.  We discussed the risks and benefits of a laparoscopic cholecystectomy including, but not limited to bleeding, infection, injury to surrounding structures such as the intestine or liver, bile leak, retained gallstones, need to convert to an open procedure, prolonged diarrhea, blood clots such as  DVT, common bile duct injury, anesthesia risks, and possible need for additional procedures.  We discussed the typical post-operative recovery course. I explained that the likelihood of improvement of their symptoms is good.  Mary SellaEric M. Andrey CampanileWilson, MD, FACS General, Bariatric, & Minimally Invasive Surgery [facility], [place]  [facility],Kashana Breach M

## 2015-09-03 NOTE — Anesthesia Postprocedure Evaluation (Signed)
Anesthesia Post Note  Patient: Emily Liu  Procedure(s) Performed: Procedure(s) (LRB): LAPAROSCOPIC CHOLECYSTECTOMY WITH INTRAOPERATIVE CHOLANGIOGRAM (N/A)  Patient location during evaluation: PACU Anesthesia Type: General Level of consciousness: awake and alert Pain management: pain level controlled Vital Signs Assessment: post-procedure vital signs reviewed and stable Respiratory status: spontaneous breathing, nonlabored ventilation and respiratory function stable Cardiovascular status: blood pressure returned to baseline and stable Postop Assessment: no signs of nausea or vomiting Anesthetic complications: no    Last Vitals:  Vitals:   09/03/15 1110 09/03/15 1124  BP: 111/69 110/65  Pulse: 91 88  Resp: 13 16  Temp: 36.6 C 36.6 C    Last Pain:  Vitals:   09/03/15 1124  TempSrc: Oral  PainSc:                  Brenda Cowher A

## 2015-09-03 NOTE — Anesthesia Procedure Notes (Signed)
Procedure Name: Intubation Date/Time: 09/03/2015 7:55 AM Performed by: Fransisca KaufmannMEYER, Jametta Moorehead E Pre-anesthesia Checklist: Patient identified, Emergency Drugs available, Suction available and Patient being monitored Patient Re-evaluated:Patient Re-evaluated prior to inductionOxygen Delivery Method: Circle System Utilized Preoxygenation: Pre-oxygenation with 100% oxygen Intubation Type: IV induction Ventilation: Mask ventilation without difficulty Laryngoscope Size: Miller and 2 Grade View: Grade I Tube type: Oral Number of attempts: 1 Airway Equipment and Method: Stylet and Oral airway Placement Confirmation: ETT inserted through vocal cords under direct vision,  positive ETCO2 and breath sounds checked- equal and bilateral Tube secured with: Tape Dental Injury: Teeth and Oropharynx as per pre-operative assessment

## 2015-09-03 NOTE — Discharge Instructions (Signed)
[facility] LAPAROSCOPIC SURGERY: POST OP INSTRUCTIONS Always review your discharge instruction sheet given to you by the facility where your surgery was performed. IF YOU HAVE DISABILITY OR FAMILY LEAVE FORMS, YOU MUST BRING THEM TO THE OFFICE FOR PROCESSING.   DO NOT GIVE THEM TO YOUR DOCTOR.  1. A prescription for pain medication may be given to you upon discharge.  Take your pain medication as prescribed, if needed.  If narcotic pain medicine is not needed, then you may take acetaminophen (Tylenol) &/or ibuprofen (Advil) as needed. 2. Take your usually prescribed medications unless otherwise directed. 3. If you need a refill on your pain medication, please contact your pharmacy.  They will contact our office to request authorization. Prescriptions will not be filled after 5pm or on week-ends. 4. You should follow a light diet the first few days after arrival home, such as soup and crackers, etc.  Be sure to include lots of fluids daily. 5. Most patients will experience some swelling and bruising in the area of the incisions.  Ice packs will help.  Swelling and bruising can take several days to resolve.  6. It is common to experience some constipation if taking pain medication after surgery.  Increasing fluid intake and taking a stool softener (such as Colace) will usually help or prevent this problem from occurring.  A mild laxative (Milk of Magnesia or Miralax) should be taken according to package instructions if there are no bowel movements after 48 hours. 7. Unless discharge instructions indicate otherwise, you may remove your bandages 48 hours after surgery, and you may shower at that time.  You  have steri-strips (small skin tapes) in place directly over the incision.  These strips should be left on the skin for 7-10 days.   8. ACTIVITIES:  You may resume regular (light) daily activities beginning the next day--such as daily self-care, walking, climbing stairs--gradually  increasing activities as tolerated.  You may have sexual intercourse when it is comfortable.  Refrain from any heavy lifting or straining until approved by your doctor. a. You may drive when you are no longer taking prescription pain medication, you can comfortably wear a seatbelt, and you can safely maneuver your car and apply brakes. 9. You should see your doctor in the office for a follow-up appointment approximately 2-3 weeks after your surgery.  Make sure that you call for this appointment within a day or two after you arrive home to insure a convenient appointment time. 10. OTHER INSTRUCTIONS: Do not lift, push, pull anything greater than 15 pounds for 2 weeks  WHEN TO CALL YOUR DOCTOR: 1. Fever over 101.0 2. Inability to urinate 3. Continued bleeding from incision. 4. Increased pain, redness, or drainage from the incision. 5. Increasing abdominal pain  The clinic staff is available to answer your questions during regular business hours.  Please dont hesitate to call and ask to speak to one of the nurses for clinical concerns.  If you have a medical emergency, go to the nearest emergency room or call 911.  [facility] is always on call at the hospital. [address], [address], [place], [place]  [id number] ? [address] [address], [place], [place]   [id number] [phone] ? [phone] ? FAX [phone] Web site: [email or web address]

## 2015-09-03 NOTE — Op Note (Signed)
Emily Liu [id number] 28-Nov-1985 09/03/2015  Laparoscopic Cholecystectomy with IOC Procedure Note  Indications: This patient presents with symptomatic gallbladder disease and will undergo laparoscopic cholecystectomy. She presented to outside hospital with upper abdominal pain and was found to have symptomatic cholelithiasis possible cholecystitis with a dilated common bile duct. She was transferred to this hospital for gastroenterology. Preoperative MRCP did not demonstrate any evidence of choledocholithiasis we were then consulted for consideration for cholecystectomy.  Pre-operative Diagnosis: Acute calculus cholecystitis  Post-operative Diagnosis: Same  Surgeon: Atilano InaWILSON,Trygg Mantz M   Assistants: None  Anesthesia: General endotracheal anesthesia  Procedure Details  The patient was seen again in the Holding Room. The risks, benefits, complications, treatment options, and expected outcomes were discussed with the patient. The possibilities of reaction to medication, pulmonary aspiration, perforation of viscus, bleeding, recurrent infection, finding a normal gallbladder, the need for additional procedures, failure to diagnose a condition, the possible need to convert to an open procedure, and creating a complication requiring transfusion or operation were discussed with the patient. The likelihood of improving the patient's symptoms with return to their baseline status is good.  The patient and/or family concurred with the proposed plan, giving informed consent. The site of surgery properly noted. The patient was taken to Operating Room, identified as Emily Liu and the procedure verified as Laparoscopic Cholecystectomy with Intraoperative Cholangiogram. A Time Out was held and the above information confirmed. Antibiotic prophylaxis was administered.   Prior to the induction of general anesthesia, antibiotic prophylaxis was administered. General endotracheal anesthesia was then administered  and tolerated well. After the induction, the abdomen was prepped with Chloraprep and draped in the sterile fashion. The patient was positioned in the supine position.  Local anesthetic agent was injected into the skin near the umbilicus and an incision made. We dissected down to the abdominal fascia with blunt dissection.  The fascia was incised vertically and we entered the peritoneal cavity bluntly.  A pursestring suture of 0-Vicryl was placed around the fascial opening.  The Hasson cannula was inserted and secured with the stay suture.  Pneumoperitoneum was then created with CO2 and tolerated well without any adverse changes in the patient's vital signs. An 5-mm port was placed in the subxiphoid position.  Two 5-mm ports were placed in the right upper quadrant. All skin incisions were infiltrated with a local anesthetic agent before making the incision and placing the trocars.   We positioned the patient in reverse Trendelenburg, tilted slightly to the patient's left.  The gallbladder was identified and it was distended unable to be grasped. Therefore I aspirated the gallbladder and drain some of the bile from it. The gallbladder was identified, the fundus grasped and retracted cephalad. The left lobe of the liver draped over the infundibulum making it very challenging to work around the cystic duct and cystic artery. Therefore I elected to place a liver retractor. A new subxiphoid incision was made and a trocar was placed. I then placed an episode liver retractor on the left lobe of liver to lift it up to provide better visualization. Adhesions were lysed bluntly and with the electrocautery where indicated, taking care not to injure any adjacent organs or viscus. The infundibulum was grasped and retracted laterally, exposing the peritoneum overlying the triangle of Calot. This was then divided and exposed in a blunt fashion. A critical view of the cystic duct and cystic artery was obtained. The patient had a  very high arching right hepatic artery which ran parallel to the cystic  duct then dove back down into the liver. With further dissection I was able to identify a very short cystic artery coming off the right hepatic artery. At this point I had an excellent critical view. Dr. Lindie Spruce had briefly join me in the operating room and reviewed the anatomy with me and he agreed with my assessment. The cystic duct was clearly identified and bluntly dissected circumferentially. The cystic duct was ligated with a clip distally.   An incision was made in the cystic duct and the [facility] cholangiogram catheter introduced. The catheter was secured using a clip. A cholangiogram was then obtained which showed good visualization of the distal and proximal biliary tree with no sign of filling defects or obstruction.  Contrast flowed easily into the duodenum. The catheter was then removed.   The cystic duct was then ligated with clips and divided. The cystic artery which had been identified & dissected free was ligated with clips and divided as well.   The gallbladder was dissected from the liver bed in retrograde fashion with the electrocautery. The gallbladder was removed and placed in an Ecco sac.  The gallbladder and Ecco sac were then removed through the umbilical port site. The liver bed was irrigated and inspected. Hemostasis was achieved with the electrocautery. Copious irrigation was utilized and was repeatedly aspirated until clear. The liver retractor was removed under direct visualization.  The pursestring suture was used to close the umbilical fascia.  An additional interrupted 0 Vicryl was placed through the umbilical fascia using an Endo Close suture passer using laparoscopic assistance  We again inspected the right upper quadrant for hemostasis.  The umbilical closure was inspected and there was no air leak and nothing trapped within the closure. Pneumoperitoneum was released as we removed the trocars.  4-0 Monocryl  was used to close the skin.   Benzoin, steri-strips, and clean dressings were applied. The patient was then extubated and brought to the recovery room in stable condition. Instrument, sponge, and needle counts were correct at closure and at the conclusion of the case.   Findings: Cholecystitis with Cholelithiasis  Estimated Blood Loss: Minimal         Drains: none         Specimens: Gallbladder           Complications: None; patient tolerated the procedure well.         Disposition: PACU - hemodynamically stable.         Condition: stable  Mary Sella. Andrey Campanile, MD, FACS General, Bariatric, & Minimally Invasive Surgery [facility], [place]

## 2015-09-03 NOTE — H&P (View-Only) (Signed)
[facility] Surgery Consult Note  Emily Liu [birth year]  [id number].    Requesting MD: Dr. Benjaman Lobe Chief Complaint/Reason for Consult: abdominal pain  HPI:  30 y/o female with a PMH of cesarean section 16 months ago who presented to [facility] on 08/31/15 with epigastric abdominal pain that began the previous afternoon. States the pain was severe and had her "doubled over" with some radiation to her back. Has had burning epigastric pain in the past, associated with meals, that resolved with use of an H2 blocker. This pain was more severe and lasted longer. Denies a PMH gastric ulcers. Associated sxs were nausea and vomiting and one episode of diarrhea. ED workup was significant for mildly elevated AST/ALT (49/53) and a RUQ U/S concerning for CBD dilatation (8.5 mm) with gallstones present in the gallbladder. Patient was transferred to [facility] where she received an MRCP revealing a 5-6 mm CBD without the presence of CBD stones. The gallbladder was mildly distended with gallstones and no pericholecystic fluid. General surgery was asked to consult regarding surgical removal of the gallbladder.   Mother in the room with patient during my exam. Today the patient denies nausea and denies abdominal pain. Kindly and appropriately reports frustration with the amount of time she has had to wait to see a Psychologist, sport and exercise. The patient is currently NPO and on IV Zosyn. Last meal was at 9:30 PM yesterday. She has NKDA and denies use of blood thinning medications. She denies fever, chills, CP, SOB, and urinary sxs.   VSS, WBC normal.  Hepatic Function Latest Ref Rng & Units 09/02/2015 09/01/2015  Total Protein 6.5 - 8.1 g/dL 5.9(L) 6.0(L)  Albumin 3.5 - 5.0 g/dL 3.3(L) 3.5  AST 15 - 41 U/L 14(L) 25  ALT 14 - 54 U/L 26 35  Alk Phosphatase 38 - 126 U/L 33(L) 37(L)  Total Bilirubin 0.3 - 1.2 mg/dL 1.4(H) 1.3(H)  Bilirubin, Direct 0.1 - 0.5 mg/dL - 0.2   ROS: All systems reviewed and otherwise negative except for  as above  Family History  Problem Relation Age of Onset  . Diabetes Maternal Grandfather     History reviewed. No pertinent past medical history.  Past Surgical History:  Procedure Laterality Date  . CESAREAN SECTION      Social History:  reports that she has never smoked. She has never used smokeless tobacco. She reports that she does not drink alcohol. Her drug history is not on file.  Allergies: No Known Allergies  Medications Prior to Admission  Medication Sig Dispense Refill  . MONONESSA 0.25-35 MG-MCG tablet Take 1 tablet by mouth daily.  11  . ranitidine (ZANTAC) 150 MG tablet Take 150 mg by mouth daily as needed for heartburn.      Blood pressure 100/63, pulse 83, temperature 98.8 F (37.1 C), temperature source Oral, resp. rate 18, height 5' 5"  (1.651 m), weight 66.5 kg (146 lb 9.6 oz), last menstrual period 08/18/2015, SpO2 99 %. Physical Exam: General: pleasant, WD/WN white female who is laying in bed in NAD HEENT: head is normocephalic, atraumatic.  Sclera are noninjected.  Heart: regular, rate, and rhythm.  No obvious murmurs, gallops, or rubs noted.  Palpable pedal pulses bilaterally Lungs: CTAB, no wheezes, rhonchi, or rales noted.  Respiratory effort nonlabored Abd: soft, mildly tender to deep palpation of epigastrium, otherwise nontender, ND, +BS MS: all 4 extremities are symmetrical with no cyanosis, clubbing, or edema. Skin: warm and dry with no masses, lesions, or rashes Psych: A&Ox3 with an appropriate affect.  Neuro: normal speech  Results for orders placed or performed during the hospital encounter of 09/01/15 (from the past 48 hour(s))  Hepatic function panel     Status: Abnormal   Collection Time: 09/01/15  2:41 AM  Result Value Ref Range   Total Protein 6.0 (L) 6.5 - 8.1 g/dL   Albumin 3.5 3.5 - 5.0 g/dL   AST 25 15 - 41 U/L   ALT 35 14 - 54 U/L   Alkaline Phosphatase 37 (L) 38 - 126 U/L   Total Bilirubin 1.3 (H) 0.3 - 1.2 mg/dL   Bilirubin,  Direct 0.2 0.1 - 0.5 mg/dL   Indirect Bilirubin 1.1 (H) 0.3 - 0.9 mg/dL  CBC with Differential/Platelet     Status: Abnormal   Collection Time: 09/01/15  2:41 AM  Result Value Ref Range   WBC 7.0 4.0 - 10.5 K/uL   RBC 3.61 (L) 3.87 - 5.11 MIL/uL   Hemoglobin 11.3 (L) 12.0 - 15.0 g/dL   HCT 33.9 (L) 36.0 - 46.0 %   MCV 93.9 78.0 - 100.0 fL   MCH 31.3 26.0 - 34.0 pg   MCHC 33.3 30.0 - 36.0 g/dL   RDW 12.7 11.5 - 15.5 %   Platelets 184 150 - 400 K/uL   Neutrophils Relative % 59 %   Neutro Abs 4.2 1.7 - 7.7 K/uL   Lymphocytes Relative 30 %   Lymphs Abs 2.1 0.7 - 4.0 K/uL   Monocytes Relative 8 %   Monocytes Absolute 0.5 0.1 - 1.0 K/uL   Eosinophils Relative 2 %   Eosinophils Absolute 0.1 0.0 - 0.7 K/uL   Basophils Relative 1 %   Basophils Absolute 0.0 0.0 - 0.1 K/uL  Basic metabolic panel     Status: Abnormal   Collection Time: 09/01/15  2:41 AM  Result Value Ref Range   Sodium 141 135 - 145 mmol/L   Potassium 3.8 3.5 - 5.1 mmol/L   Chloride 111 101 - 111 mmol/L   CO2 25 22 - 32 mmol/L   Glucose, Bld 85 65 - 99 mg/dL   BUN 5 (L) 6 - 20 mg/dL   Creatinine, Ser 0.64 0.44 - 1.00 mg/dL   Calcium 8.5 (L) 8.9 - 10.3 mg/dL   GFR calc non Af Amer >60 >60 mL/min   GFR calc Af Amer >60 >60 mL/min    Comment: (NOTE) The eGFR has been calculated using the CKD EPI equation. This calculation has not been validated in all clinical situations. eGFR's persistently <60 mL/min signify possible Chronic Kidney Disease.    Anion gap 5 5 - 15  Pregnancy, urine     Status: None   Collection Time: 09/01/15  6:34 AM  Result Value Ref Range   Preg Test, Ur NEGATIVE NEGATIVE    Comment:        THE SENSITIVITY OF THIS METHODOLOGY IS >20 mIU/mL.   Comprehensive metabolic panel     Status: Abnormal   Collection Time: 09/02/15  5:40 AM  Result Value Ref Range   Sodium 141 135 - 145 mmol/L   Potassium 3.7 3.5 - 5.1 mmol/L   Chloride 110 101 - 111 mmol/L   CO2 26 22 - 32 mmol/L   Glucose, Bld 87  65 - 99 mg/dL   BUN <5 (L) 6 - 20 mg/dL   Creatinine, Ser 0.71 0.44 - 1.00 mg/dL   Calcium 8.3 (L) 8.9 - 10.3 mg/dL   Total Protein 5.9 (L) 6.5 - 8.1 g/dL   Albumin 3.3 (  L) 3.5 - 5.0 g/dL   AST 14 (L) 15 - 41 U/L   ALT 26 14 - 54 U/L   Alkaline Phosphatase 33 (L) 38 - 126 U/L   Total Bilirubin 1.4 (H) 0.3 - 1.2 mg/dL   GFR calc non Af Amer >60 >60 mL/min   GFR calc Af Amer >60 >60 mL/min    Comment: (NOTE) The eGFR has been calculated using the CKD EPI equation. This calculation has not been validated in all clinical situations. eGFR's persistently <60 mL/min signify possible Chronic Kidney Disease.    Anion gap 5 5 - 15  CBC with Differential/Platelet     Status: Abnormal   Collection Time: 09/02/15  5:40 AM  Result Value Ref Range   WBC 5.7 4.0 - 10.5 K/uL   RBC 3.62 (L) 3.87 - 5.11 MIL/uL   Hemoglobin 11.0 (L) 12.0 - 15.0 g/dL   HCT 34.6 (L) 36.0 - 46.0 %   MCV 95.6 78.0 - 100.0 fL   MCH 30.4 26.0 - 34.0 pg   MCHC 31.8 30.0 - 36.0 g/dL   RDW 12.8 11.5 - 15.5 %   Platelets 182 150 - 400 K/uL   Neutrophils Relative % 47 %   Neutro Abs 2.6 1.7 - 7.7 K/uL   Lymphocytes Relative 40 %   Lymphs Abs 2.3 0.7 - 4.0 K/uL   Monocytes Relative 9 %   Monocytes Absolute 0.5 0.1 - 1.0 K/uL   Eosinophils Relative 3 %   Eosinophils Absolute 0.2 0.0 - 0.7 K/uL   Basophils Relative 1 %   Basophils Absolute 0.1 0.0 - 0.1 K/uL   Mr 3d Recon At Scanner  Result Date: 09/01/2015 CLINICAL DATA:  30 year old female inpatient with acute epigastric abdominal pain and nausea, with cholelithiasis, dilated common bile duct and possible choledocholithiasis on ultrasound from 1 day prior. EXAM: MRI ABDOMEN WITHOUT AND WITH CONTRAST (INCLUDING MRCP) TECHNIQUE: Multiplanar multisequence MR imaging of the abdomen was performed both before and after the administration of intravenous contrast. Heavily T2-weighted images of the biliary and pancreatic ducts were obtained, and three-dimensional MRCP images were  rendered by post processing. CONTRAST:  50m MULTIHANCE GADOBENATE DIMEGLUMINE 529 MG/ML IV SOLN COMPARISON:  08/31/2015 right upper quadrant abdominal sonogram. FINDINGS: Lower chest: Subsegmental dependent atelectasis at both lung bases. Hepatobiliary: Normal liver size and configuration. Heterogeneous mild hepatic steatosis predominantly involving the right liver lobe. No liver mass. There are a few gallstones within the gallbladder measuring up to 1.3 cm with borderline mild gallbladder distention. There is mild eccentric gallbladder wall thickening adjacent to the liver. No pericholecystic fluid. No intrahepatic biliary ductal dilatation. Common bile duct diameter 5-6 mm, within normal limits. No choledocholithiasis. No biliary stricture or biliary or ampullary mass. Pancreas: No pancreatic mass or duct dilation.  No pancreas divisum. Spleen: Normal size. No mass. Adrenals/Urinary Tract: Normal adrenals. No hydronephrosis. Normal kidneys with no renal mass. Stomach/Bowel: Grossly normal stomach. Visualized small and large bowel is normal caliber, with no bowel wall thickening. Vascular/Lymphatic: Normal caliber abdominal aorta. Patent portal, splenic, hepatic and renal veins. No pathologically enlarged lymph nodes in the abdomen. Other: No abdominal ascites or focal fluid collection. 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 Musculoskeletal: No aggressive appearing focal osseous lesions. IMPRESSION: 1. Cholelithiasis. Borderline mild gallbladder distention. Mild eccentric gallbladder wall thickening adjacent to the liver. No pericholecystic fluid. MRI findings are equivocal for acute cholecystitis. Clinical correlation is necessary. Consider correlation with hepatobiliary scintigraphy as clinically warranted.  2. No biliary ductal dilatation. Common bile duct diameter 5-6 mm. No choledocholithiasis. 3. Incidentally detected developmental uterine anomaly,  suggestive of septate or didelphys uterus. If clinically warranted, this could be further evaluated with transabdominal and transvaginal ultrasound and/or dedicated pelvic MRI on an outpatient basis. Electronically Signed   By: Ilona Sorrel M.D.   On: 09/01/2015 12:41   Mr Jeananne Rama W/wo Cm/mrcp  Result Date: 09/01/2015 CLINICAL DATA:  30 year old female inpatient with acute epigastric abdominal pain and nausea, with cholelithiasis, dilated common bile duct and possible choledocholithiasis on ultrasound from 1 day prior. EXAM: MRI ABDOMEN WITHOUT AND WITH CONTRAST (INCLUDING MRCP) TECHNIQUE: Multiplanar multisequence MR imaging of the abdomen was performed both before and after the administration of intravenous contrast. Heavily T2-weighted images of the biliary and pancreatic ducts were obtained, and three-dimensional MRCP images were rendered by post processing. CONTRAST:  84m MULTIHANCE GADOBENATE DIMEGLUMINE 529 MG/ML IV SOLN COMPARISON:  08/31/2015 right upper quadrant abdominal sonogram. FINDINGS: Lower chest: Subsegmental dependent atelectasis at both lung bases. Hepatobiliary: Normal liver size and configuration. Heterogeneous mild hepatic steatosis predominantly involving the right liver lobe. No liver mass. There are a few gallstones within the gallbladder measuring up to 1.3 cm with borderline mild gallbladder distention. There is mild eccentric gallbladder wall thickening adjacent to the liver. No pericholecystic fluid. No intrahepatic biliary ductal dilatation. Common bile duct diameter 5-6 mm, within normal limits. No choledocholithiasis. No biliary stricture or biliary or ampullary mass. Pancreas: No pancreatic mass or duct dilation.  No pancreas divisum. Spleen: Normal size. No mass. Adrenals/Urinary Tract: Normal adrenals. No hydronephrosis. Normal kidneys with no renal mass. Stomach/Bowel: Grossly normal stomach. Visualized small and large bowel is normal caliber, with no bowel wall thickening.  Vascular/Lymphatic: Normal caliber abdominal aorta. Patent portal, splenic, hepatic and renal veins. No pathologically enlarged lymph nodes in the abdomen. Other: No abdominal ascites or focal fluid collection. 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 Musculoskeletal: No aggressive appearing focal osseous lesions. IMPRESSION: 1. Cholelithiasis. Borderline mild gallbladder distention. Mild eccentric gallbladder wall thickening adjacent to the liver. No pericholecystic fluid. MRI findings are equivocal for acute cholecystitis. Clinical correlation is necessary. Consider correlation with hepatobiliary scintigraphy as clinically warranted. 2. No biliary ductal dilatation. Common bile duct diameter 5-6 mm. No choledocholithiasis. 3. Incidentally detected developmental uterine anomaly, suggestive of septate or didelphys uterus. If clinically warranted, this could be further evaluated with transabdominal and transvaginal ultrasound and/or dedicated pelvic MRI on an outpatient basis. Electronically Signed   By: JIlona SorrelM.D.   On: 09/01/2015 12:41   Assessment/Plan Abdominal pain Symptomatic [facility]lithiasis [facility] CBD [facility] MRCP negative for CBD obstruction  - AST/ALT normal, no leukocytosis, and sxs resolving on IV Zosyn   FEN: NPO after MN ID: Zosyn DVT Proph: SCD's Dispo: laparoscopic cholecystectomy tomorrow [facility] 09[facility]02/2015, 8:14 AM Pager: [phone]: [phone]-Fri 7:00 am-4:30 pm Sat-Sun 7:00 am-11:30 am

## 2015-09-03 NOTE — Progress Notes (Signed)
Arrived from PACU at this time. Mother at bedside. Ambulated to chair, gait steady, denies pain/nausea.

## 2015-09-03 NOTE — Progress Notes (Signed)
Pt discharged. Discharge paper reviewed, pt understand. Prescription sent with her.

## 2015-09-03 NOTE — Discharge Summary (Signed)
Physician Discharge Summary  Emily HerrlichBethany L Calderone RUE:[id number]:[id number] DOB: 1985/09/29 DOA: 09/01/2015  PCP: Blane OharaOX,KIRSTEN, MD  Admit date: 09/01/2015 Discharge date: 09/03/2015  Time spent: 35 minutes  Recommendations for Outpatient Follow-up:  1. Gen surg f/u 8/23   Discharge Diagnoses:  Principal Problem:   Abdominal pain Active Problems:   Choledocholithiasis   Discharge Condition: stable  Diet recommendation: regular  Filed Weights   09/01/15 0100  Weight: 66.5 kg (146 lb 9.6 oz)    History of present illness:  HPI: Emily Liu is a 30 y.o. female with no significant past medical history started experiencing abdominal pain last afternoon. Pain is mostly epigastric in area with some nausea. Denies any diarrhea fever chills. In the ER labs revealed mildly elevated AST and ALT around 49 and 53 with sonogram of the abdomen showing CBD measuring 8.5 mm and finding concerning for choledocholithiasis. Gallbladder showed gallstones with no features of acute cholecystitis. ER physician had [facility]. On my exam patient is presently not in distress. Abdomen is mildly tender in the right upper quadrant and epigastrium.    Hospital Course:  CT showed possiblle choledocholithiasis but MRCP neg. Gen surg did Laparoscopic cholecystectomy.Patient was on antibiotics while inpatient until procedure completed. No complications.Pregnant buttOkay safe Discharge home.  Procedures: 8/11 Laparoscopic Cholecystectomy with IOC  Consultations:  Gen surg, GI  Discharge Exam: Vitals:   09/03/15 1110 09/03/15 1124  BP: 111/69 110/65  Pulse: 91 88  Resp: 13 16  Temp: 97.9 F (36.6 C) 97.9 F (36.6 C)     General:  No diaphoresis, anxious, no acute distress  Cardiovascular: Regular rate and rhythm no murmurs rubs or gallops  Respiratory: Clear to auscultation bilaterally no more breathing  Abdomen:  Nondistended bowel sounds normal nontender palpation  Musculoskeletal: Moving all extremities, no deformity, 5 out of 5 strength    Discharge Instructions  Patient has had 1 narcotic prescription in the last 18 months.  drug monitoring program with page reviewed by me.  Discharge Medication List as of 09/03/2015  5:40 PM    START taking these medications   Details  acetaminophen (TYLENOL) 325 MG tablet Take 2 tablets (650 mg total) by mouth every 6 (six) hours as needed for mild pain or moderate pain (or Fever >/= 101)., Starting Fri 09/03/2015, Normal    ibuprofen (ADVIL,MOTRIN) 800 MG tablet Take 1 tablet (800 mg total) by mouth every 8 (eight) hours as needed for fever or mild pain., Starting Fri 09/03/2015, Normal    ondansetron (ZOFRAN) 4 MG tablet Take 1 tablet (4 mg total) by mouth every 6 (six) hours as needed for nausea., Starting Fri 09/03/2015, Normal    oxyCODONE (OXY IR/ROXICODONE) 5 MG immediate release tablet Take 1-2 tablets (5-10 mg total) by mouth every 6 (six) hours as needed for moderate pain or severe pain., Starting Fri 09/03/2015, Print      CONTINUE these medications which have NOT CHANGED   Details  MONONESSA 0.25-35 MG-MCG tablet Take 1 tablet by mouth daily., Starting Sat 08/07/2015, Historical Med    ranitidine (ZANTAC) 150 MG tablet Take 150 mg by mouth daily as needed for heartburn., Historical Med       Allergies  Allergen Reactions  . No Known Allergies Other (See Comments)   Follow-up Information    [facility] Go on 09/15/2015.   Specialty:  General Surgery Why:  at 12:00 PM for post-operative follow up from  your gallbladder removal. Please arrive 30 minutes early (11:30) to get checked in and fill out any necessary paperwork. Contact information: [address] [place] [address] [phone]           The results of significant diagnostics from this hospitalization (including imaging,  microbiology, ancillary and laboratory) are listed below for reference.    Significant Diagnostic Studies: Dg Cholangiogram Operative  Result Date: 09/03/2015 CLINICAL DATA:  Cholecystectomy for symptomatic cholelithiasis. EXAM: INTRAOPERATIVE CHOLANGIOGRAM TECHNIQUE: Cholangiographic images from the C-arm fluoroscopic device were submitted for interpretation post-operatively. Please see the procedural report for the amount of contrast and the fluoroscopy time utilized. COMPARISON:  MRCP on 09/01/2015 FINDINGS: Intraoperative imaging with a C-arm demonstrates a normal opacified biliary tree without evidence of filling defect or obstruction. There is a relatively low cystic duct insertion. No contrast extravasation identified. IMPRESSION: Unremarkable intraoperative cholangiogram. Electronically Signed   By: Irish Lack M.D.   On: 09/03/2015 10:05   Mr 3d Recon At Scanner  Result Date: 09/01/2015 CLINICAL DATA:  30 year old female inpatient with acute epigastric abdominal pain and nausea, with cholelithiasis, dilated common bile duct and possible choledocholithiasis on ultrasound from 1 day prior. EXAM: MRI ABDOMEN WITHOUT AND WITH CONTRAST (INCLUDING MRCP) TECHNIQUE: Multiplanar multisequence MR imaging of the abdomen was performed both before and after the administration of intravenous contrast. Heavily T2-weighted images of the biliary and pancreatic ducts were obtained, and three-dimensional MRCP images were rendered by post processing. CONTRAST:  13mL MULTIHANCE GADOBENATE DIMEGLUMINE 529 MG/ML IV SOLN COMPARISON:  08/31/2015 right upper quadrant abdominal sonogram. FINDINGS: Lower chest: Subsegmental dependent atelectasis at both lung bases. Hepatobiliary: Normal liver size and configuration. Heterogeneous mild hepatic steatosis predominantly involving the right liver lobe. No liver mass. There are a few gallstones within the gallbladder measuring up to 1.3 cm with borderline mild gallbladder  distention. There is mild eccentric gallbladder wall thickening adjacent to the liver. No pericholecystic fluid. No intrahepatic biliary ductal dilatation. Common bile duct diameter 5-6 mm, within normal limits. No choledocholithiasis. No biliary stricture or biliary or ampullary mass. Pancreas: No pancreatic mass or duct dilation.  No pancreas divisum. Spleen: Normal size. No mass. Adrenals/Urinary Tract: Normal adrenals. No hydronephrosis. Normal kidneys with no renal mass. Stomach/Bowel: Grossly normal stomach. Visualized small and large bowel is normal caliber, with no bowel wall thickening. Vascular/Lymphatic: Normal caliber abdominal aorta. Patent portal, splenic, hepatic and renal veins. No pathologically enlarged lymph nodes in the abdomen. Other: No abdominal ascites or focal fluid collection. 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 Musculoskeletal: No aggressive appearing focal osseous lesions. IMPRESSION: 1. Cholelithiasis. Borderline mild gallbladder distention. Mild eccentric gallbladder wall thickening adjacent to the liver. No pericholecystic fluid. MRI findings are equivocal for acute cholecystitis. Clinical correlation is necessary. Consider correlation with hepatobiliary scintigraphy as clinically warranted. 2. No biliary ductal dilatation. Common bile duct diameter 5-6 mm. No choledocholithiasis. 3. Incidentally detected developmental uterine anomaly, suggestive of septate or didelphys uterus. If clinically warranted, this could be further evaluated with transabdominal and transvaginal ultrasound and/or dedicated pelvic MRI on an outpatient basis. Electronically Signed   By: Delbert Phenix M.D.   On: 09/01/2015 12:41   Mr Roe Coombs W/wo Cm/mrcp  Result Date: 09/01/2015 CLINICAL DATA:  30 year old female inpatient with acute epigastric abdominal pain and nausea, with cholelithiasis, dilated common bile duct and possible choledocholithiasis on  ultrasound from 1 day prior. EXAM: MRI ABDOMEN WITHOUT AND WITH CONTRAST (INCLUDING MRCP) TECHNIQUE: Multiplanar multisequence  MR imaging of the abdomen was performed both before and after the administration of intravenous contrast. Heavily T2-weighted images of the biliary and pancreatic ducts were obtained, and three-dimensional MRCP images were rendered by post processing. CONTRAST:  13mL MULTIHANCE GADOBENATE DIMEGLUMINE 529 MG/ML IV SOLN COMPARISON:  08/31/2015 right upper quadrant abdominal sonogram. FINDINGS: Lower chest: Subsegmental dependent atelectasis at both lung bases. Hepatobiliary: Normal liver size and configuration. Heterogeneous mild hepatic steatosis predominantly involving the right liver lobe. No liver mass. There are a few gallstones within the gallbladder measuring up to 1.3 cm with borderline mild gallbladder distention. There is mild eccentric gallbladder wall thickening adjacent to the liver. No pericholecystic fluid. No intrahepatic biliary ductal dilatation. Common bile duct diameter 5-6 mm, within normal limits. No choledocholithiasis. No biliary stricture or biliary or ampullary mass. Pancreas: No pancreatic mass or duct dilation.  No pancreas divisum. Spleen: Normal size. No mass. Adrenals/Urinary Tract: Normal adrenals. No hydronephrosis. Normal kidneys with no renal mass. Stomach/Bowel: Grossly normal stomach. Visualized small and large bowel is normal caliber, with no bowel wall thickening. Vascular/Lymphatic: Normal caliber abdominal aorta. Patent portal, splenic, hepatic and renal veins. No pathologically enlarged lymph nodes in the abdomen. Other: No abdominal ascites or focal fluid collection. 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 Musculoskeletal: No aggressive appearing focal osseous lesions. IMPRESSION: 1. Cholelithiasis. Borderline mild gallbladder distention. Mild eccentric gallbladder wall thickening adjacent to  the liver. No pericholecystic fluid. MRI findings are equivocal for acute cholecystitis. Clinical correlation is necessary. Consider correlation with hepatobiliary scintigraphy as clinically warranted. 2. No biliary ductal dilatation. Common bile duct diameter 5-6 mm. No choledocholithiasis. 3. Incidentally detected developmental uterine anomaly, suggestive of septate or didelphys uterus. If clinically warranted, this could be further evaluated with transabdominal and transvaginal ultrasound and/or dedicated pelvic MRI on an outpatient basis. Electronically Signed   By: Delbert Phenix M.D.   On: 09/01/2015 12:41    Microbiology: Recent Results (from the past 240 hour(s))  Surgical pcr screen     Status: None   Collection Time: 09/03/15  2:44 AM  Result Value Ref Range Status   MRSA, PCR NEGATIVE NEGATIVE Final   Staphylococcus aureus NEGATIVE NEGATIVE Final    Comment:        The Xpert SA Assay (FDA approved for NASAL specimens in patients over 76 years of age), is one component of a comprehensive surveillance program.  Test performance has been validated by [facility] for patients greater than or equal to 35 year old. It is not intended to diagnose infection nor to guide or monitor treatment.      Labs: Basic Metabolic Panel:  Recent Labs Lab 09/01/15 0241 09/02/15 0540 09/03/15 0223  NA 141 141 141  K 3.8 3.7 3.5  CL 111 110 107  CO2 25 26 25   GLUCOSE 85 87 107*  BUN 5* <5* 8  CREATININE 0.64 0.71 0.67  CALCIUM 8.5* 8.3* 8.9   Liver Function Tests:  Recent Labs Lab 09/01/15 0241 09/02/15 0540  AST 25 14*  ALT 35 26  ALKPHOS 37* 33*  BILITOT 1.3* 1.4*  PROT 6.0* 5.9*  ALBUMIN 3.5 3.3*   No results for input(s): LIPASE, AMYLASE in the last 168 hours. No results for input(s): AMMONIA in the last 168 hours. CBC:  Recent Labs Lab 09/01/15 0241 09/02/15 0540 09/03/15 0223  WBC 7.0 5.7 6.3  NEUTROABS 4.2 2.6 3.0  HGB 11.3* 11.0* 11.3*  HCT 33.9* 34.6*  35.2*  MCV  93.9 95.6 94.4  PLT 184 182 194   Cardiac Enzymes: No results for input(s): CKTOTAL, CKMB, CKMBINDEX, TROPONINI in the last 168 hours. BNP: BNP (last 3 results) No results for input(s): BNP in the last 8760 hours.  ProBNP (last 3 results) No results for input(s): PROBNP in the last 8760 hours.  CBG: No results for input(s): GLUCAP in the last 168 hours.     Signed:  Haydee Salter MD  FACP  [facility] 09/03/2015, 5:01 PM

## 2015-09-03 NOTE — Transfer of Care (Signed)
Immediate Anesthesia Transfer of Care Note  Patient: Emily Liu  Procedure(s) Performed: Procedure(s): LAPAROSCOPIC CHOLECYSTECTOMY WITH INTRAOPERATIVE CHOLANGIOGRAM (N/A)  Patient Location: PACU  Anesthesia Type:General  Level of Consciousness: awake, alert , oriented and sedated  Airway & Oxygen Therapy: Patient Spontanous Breathing and Patient connected to face mask oxygen  Post-op Assessment: Report given to RN, Post -op Vital signs reviewed and stable and Patient moving all extremities  Post vital signs: Reviewed and stable  Last Vitals:  Vitals:   09/03/15 0548 09/03/15 1010  BP: 111/73 (P) 117/70  Pulse: 81   Resp: 16   Temp: 36.9 C (P) 36.6 C    Last Pain:  Vitals:   09/03/15 0548  TempSrc: Oral  PainSc:          Complications: No apparent anesthesia complications

## 2015-09-06 ENCOUNTER — Encounter (HOSPITAL_COMMUNITY): Payer: Self-pay | Admitting: General Surgery

## 2017-01-30 IMAGING — MR MR 3D RECON AT SCANNER
13 of 20 series · 25 of 48 positions shown · IV contrast (multihance)
Comparison: 08/31/2015 right upper quadrant abdominal sonogram.

CLINICAL DATA: 29-year-old female inpatient with acute epigastric
abdominal pain and nausea, with cholelithiasis, dilated common bile
duct and possible choledocholithiasis on ultrasound from 1 day
prior.

EXAM:
MRI ABDOMEN WITHOUT AND WITH CONTRAST (INCLUDING MRCP)
TECHNIQUE: Multiplanar multisequence MR imaging of the abdomen was performed
both before and after the administration of intravenous contrast.
Heavily T2-weighted images of the biliary and pancreatic ducts were
obtained, and three-dimensional MRCP images were rendered by post
processing.
CONTRAST:  13mL MULTIHANCE GADOBENATE DIMEGLUMINE 529 MG/ML IV SOLN

[Series 4: cor ssfse nav · coronal · 6.0mm · 0.78mm/px · 2 of 28 slices shown]
[im 1/28]
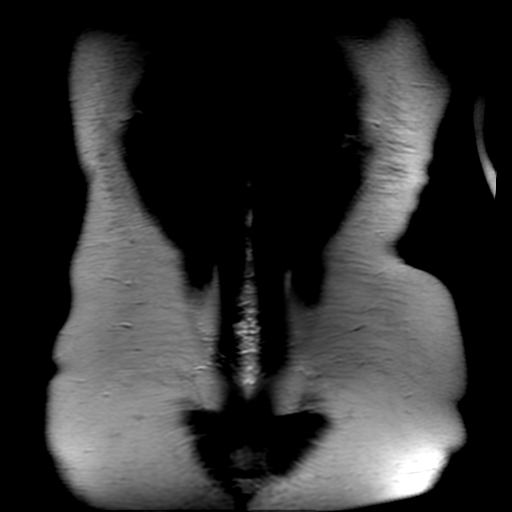
[im 28/28]
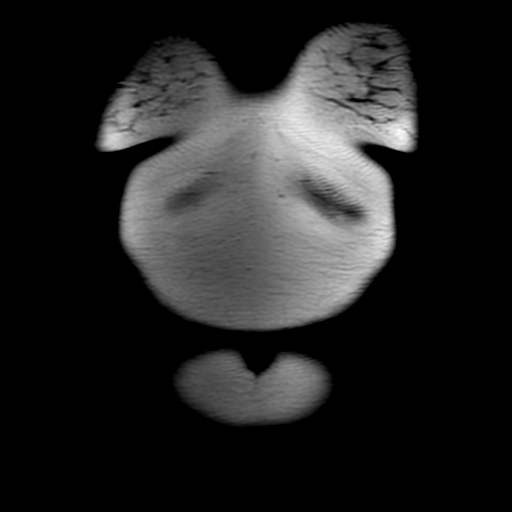

[Series 6: ax ssfse nav · axial · 6.0mm · 0.74mm/px · z∈[-35,+169]mm · 2 of 35 slices shown]
[im 1/35]
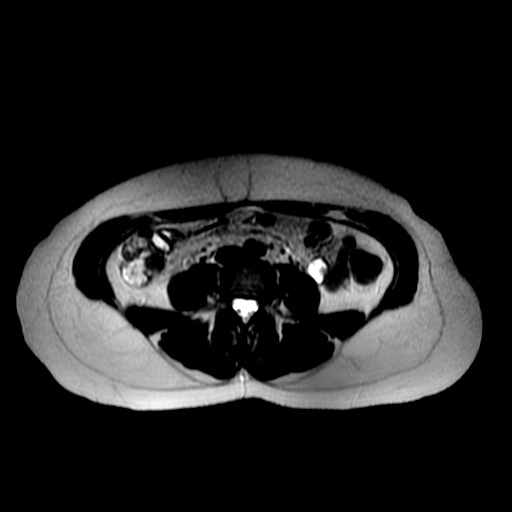
[im 35/35]
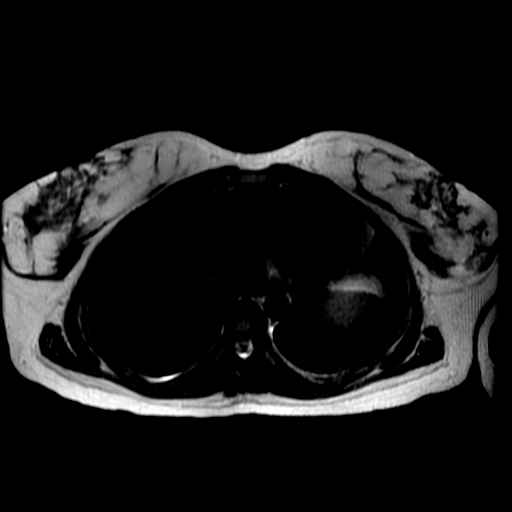

[Series 7: T2 fat-sat · axial · 6.0mm · 0.74mm/px · 1 of 32 slices shown]
[im 1/32]
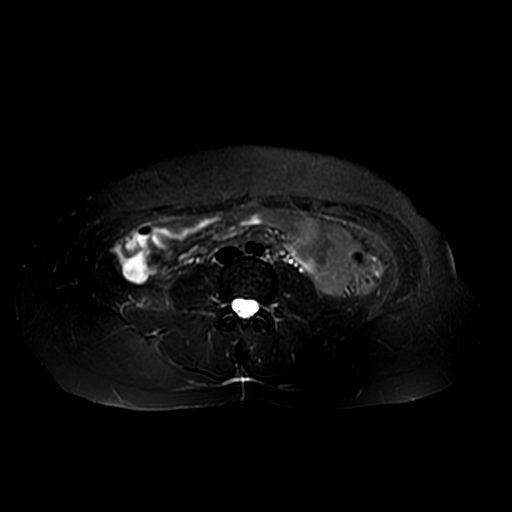

[Series 10: DWI b500 · axial · 8.0mm · 1.56mm/px · 1 of 42 slices shown]
[im 1/42]
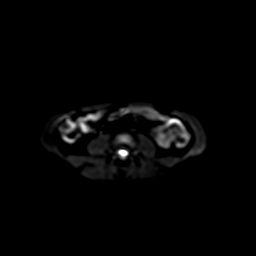

[Series 12: radial 2d thick · coronal · 40.0mm · 0.86mm/px · 1 of 6 slices shown]
[im 1/6]
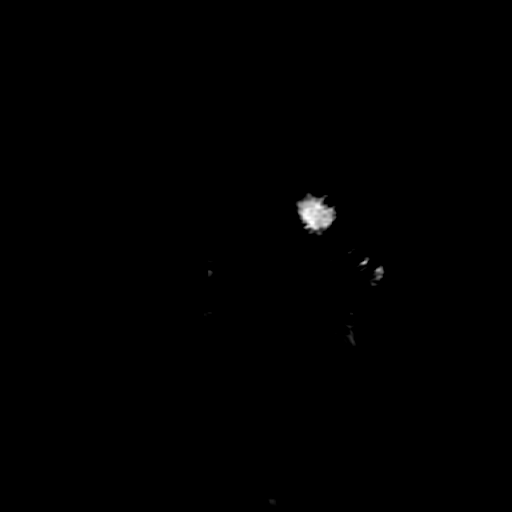

[Series 14: T1 dynamic · coronal · 3.4mm · 1.56mm/px · 3 of 96 slices shown (1 of 3)]
[im 1/96]
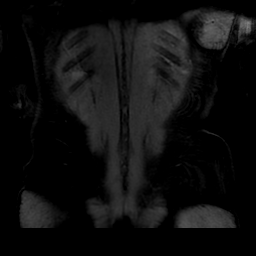
[im 48/96]
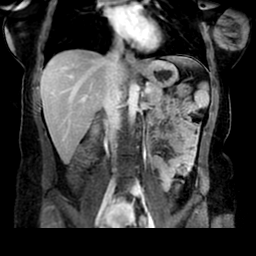
[im 96/96]
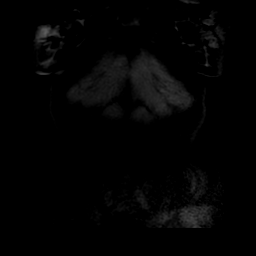

[Series 901: multiplanar reconstruction (mpr) · coronal · 15.0mm · 0.55mm/px · 1 of 48 slices shown]
[im 1/48]
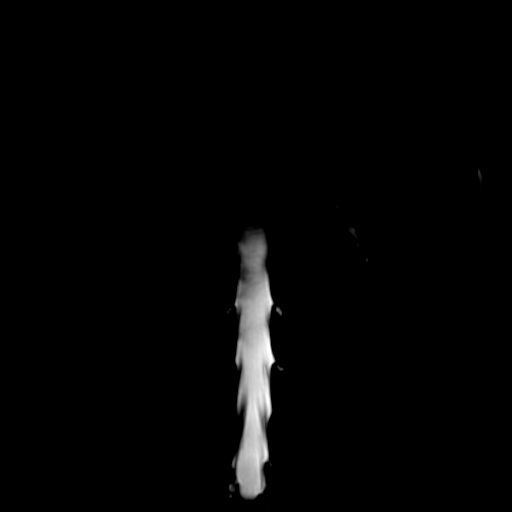

[Series 1050: ADC · axial · 8.0mm · 1.56mm/px · 1 of 21 slices shown]
[im 1/21]
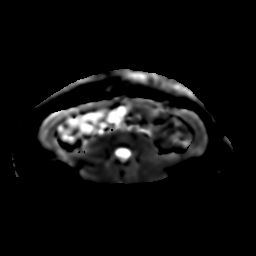

[Series 1101: T1 dynamic · axial · 4.0mm · 1.48mm/px · z∈[-26,+180]mm · 3 of 104 slices shown (2 of 3)]
[im 1/104]
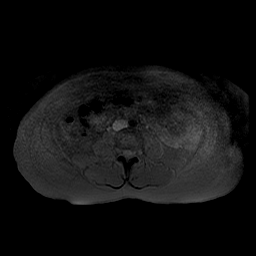
[im 52/104]
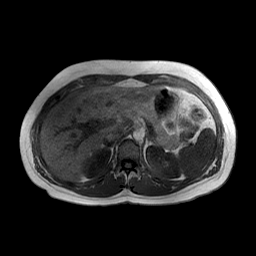
[im 104/104]
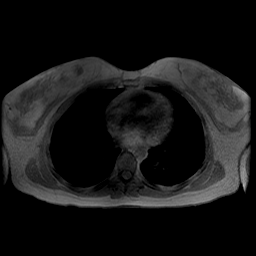

[Series 1102: T1 dynamic · axial · 4.0mm · 1.48mm/px · z∈[-26,+180]mm · 3 of 104 slices shown (3 of 3)]
[im 1/104]
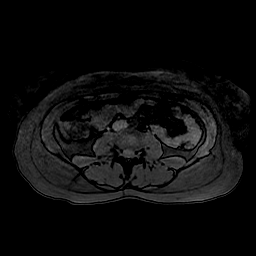
[im 52/104]
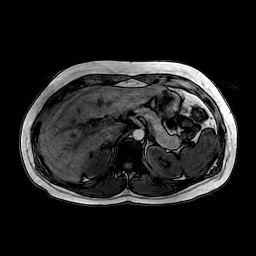
[im 104/104]
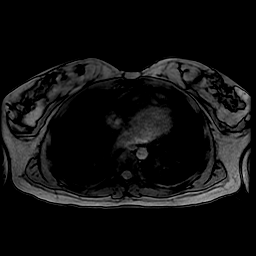

[Series 1300: T1 dynamic post-contrast · axial · non-contrast · 4.0mm · 0.74mm/px · z∈[-38,+176]mm · 3 of 108 slices shown (1 of 3)]
[im 1/108]
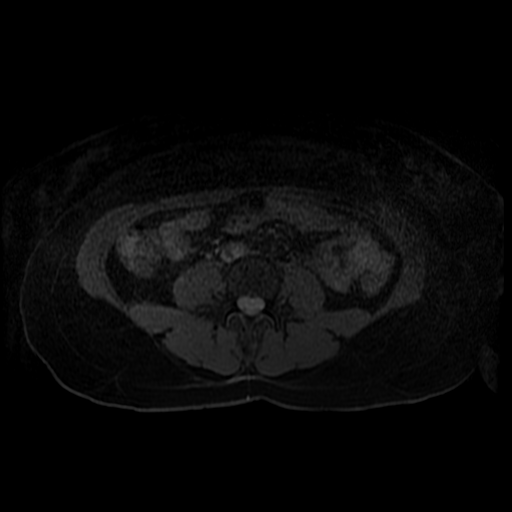
[im 54/108]
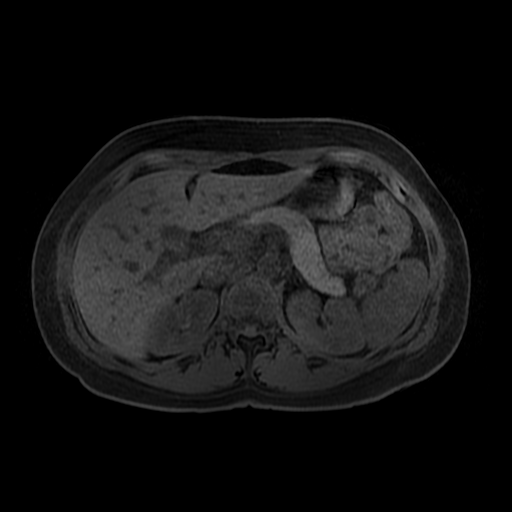
[im 108/108]
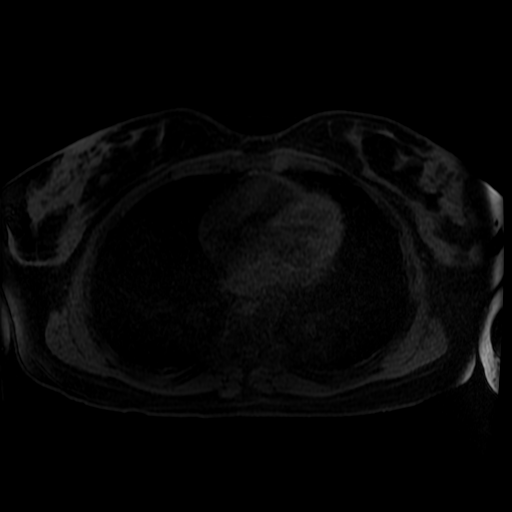

[Series 1301: T1 dynamic post-contrast · axial · non-contrast · 4.0mm · 0.74mm/px · z∈[-38,+176]mm · 3 of 108 slices shown (2 of 3)]
[im 1/108]
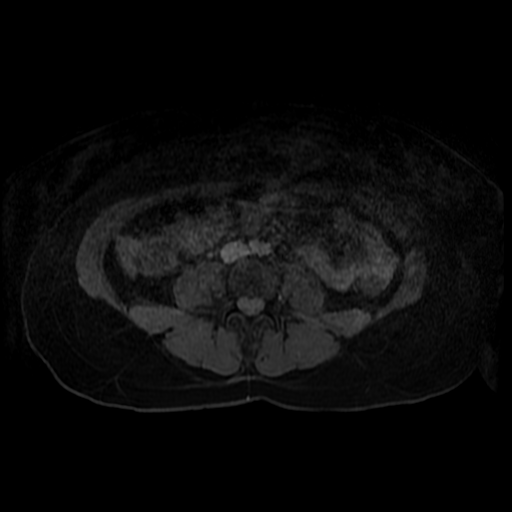
[im 54/108]
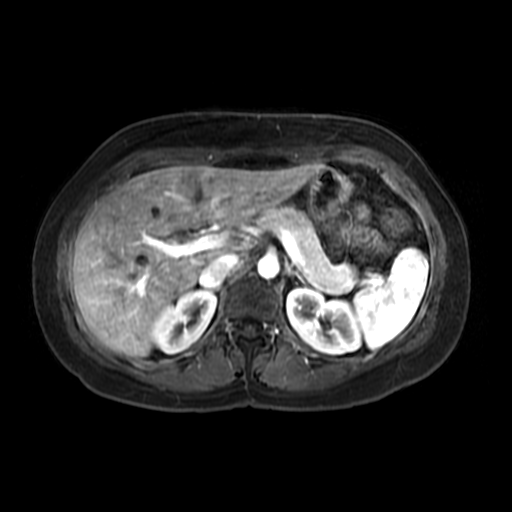
[im 108/108]
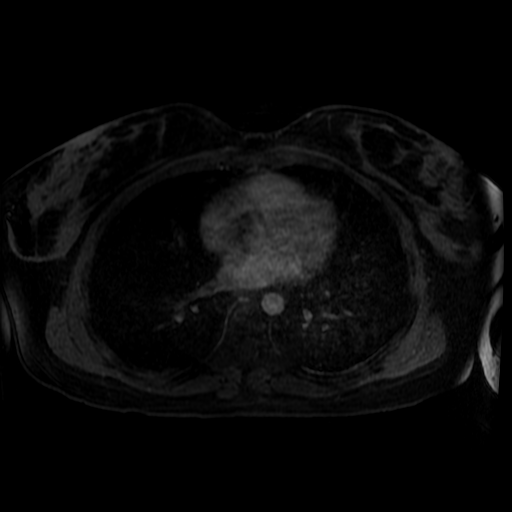

[Series 1302: T1 dynamic post-contrast · axial · non-contrast · 4.0mm · 0.74mm/px · 1 of 108 slices shown (3 of 3)]
[im 1/108]
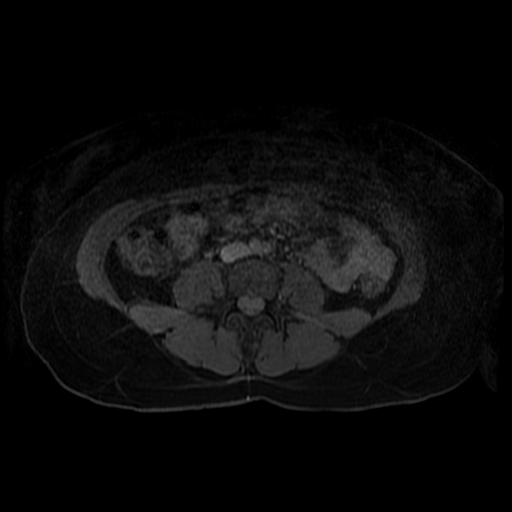

[25 of 48 positions shown; findings below may reference images not displayed]

FINDINGS: Lower chest: Subsegmental dependent atelectasis at both lung bases.

Hepatobiliary: Normal liver size and configuration. Heterogeneous
mild hepatic steatosis predominantly involving the right liver lobe.
No liver mass. There are a few gallstones within the gallbladder
measuring up to 1.3 cm with borderline mild gallbladder distention.
There is mild eccentric gallbladder wall thickening adjacent to the
liver. No pericholecystic fluid. No intrahepatic biliary ductal
dilatation. Common bile duct diameter 5-6 mm, within normal limits.
No choledocholithiasis. No biliary stricture or biliary or ampullary
mass.

Pancreas: No pancreatic mass or duct dilation.  No pancreas divisum.

Spleen: Normal size. No mass.

Adrenals/Urinary Tract: Normal adrenals. No hydronephrosis. Normal
kidneys with no renal mass.

Stomach/Bowel: Grossly normal stomach. Visualized small and large
bowel is normal caliber, with no bowel wall thickening.

Vascular/Lymphatic: Normal caliber abdominal aorta. Patent portal,
splenic, hepatic and renal veins. No pathologically enlarged lymph
nodes in the abdomen.

Other: No abdominal ascites or focal fluid collection. Partially
visualized is a developmental uterine anomaly suggestive of septate
or didelphys uterus.

Musculoskeletal: No aggressive appearing focal osseous lesions.
IMPRESSION: 1. Cholelithiasis. Borderline mild gallbladder distention. Mild
eccentric gallbladder wall thickening adjacent to the liver. No
pericholecystic fluid. MRI findings are equivocal for acute
cholecystitis. Clinical correlation is necessary. Consider
correlation with hepatobiliary scintigraphy as clinically warranted.
2. No biliary ductal dilatation. Common bile duct diameter 5-6 mm.
No choledocholithiasis.
3. Incidentally detected developmental uterine anomaly, suggestive
of septate or didelphys uterus. If clinically warranted, this could
be further evaluated with transabdominal and transvaginal ultrasound
and/or dedicated pelvic MRI on an outpatient basis.

## 2020-09-07 ENCOUNTER — Other Ambulatory Visit: Payer: Self-pay | Admitting: Obstetrics

## 2020-09-07 DIAGNOSIS — Q513 Bicornate uterus: Secondary | ICD-10-CM
